# Patient Record
Sex: Female | Born: 1995 | Race: White | Hispanic: Yes | Marital: Married | Smoking: Never smoker
Health system: Southern US, Community
[De-identification: ages and names within clinical notes are randomized; demographics above are authoritative.]

## PROBLEM LIST (undated history)

## (undated) ENCOUNTER — Inpatient Hospital Stay (HOSPITAL_COMMUNITY): Payer: Self-pay

## (undated) DIAGNOSIS — F419 Anxiety disorder, unspecified: Secondary | ICD-10-CM

## (undated) DIAGNOSIS — F32A Depression, unspecified: Secondary | ICD-10-CM

## (undated) HISTORY — PX: TONSILLECTOMY: SUR1361

---

## 2013-12-25 DIAGNOSIS — E049 Nontoxic goiter, unspecified: Secondary | ICD-10-CM | POA: Insufficient documentation

## 2019-11-01 ENCOUNTER — Ambulatory Visit: Payer: Self-pay | Attending: Internal Medicine

## 2019-11-01 DIAGNOSIS — Z23 Encounter for immunization: Secondary | ICD-10-CM

## 2019-11-01 NOTE — Progress Notes (Signed)
   Covid-19 Vaccination Clinic  Name:  Evelyn Landry    MRN: [id number] DOB: [birth year]  11/01/2019  [name] was observed post Covid-19 immunization for 15 minutes without incident. She was provided with Vaccine Information Sheet and instruction to access the V-Safe system.   [name] was instructed to call 911 with any severe reactions post vaccine: Marland Kitchen Difficulty breathing  . Swelling of face and throat  . A fast heartbeat  . A bad rash all over body  . Dizziness and weakness   Immunizations Administered    Name Date Dose VIS Date Route   Pfizer COVID-19 Vaccine 11/01/2019  1:27 PM 0.3 mL 07/17/2019 Intramuscular   Manufacturer: [employer], [employer]   Lot: [id number]   NDC: [address]-3

## 2019-11-22 ENCOUNTER — Ambulatory Visit: Payer: Self-pay

## 2019-11-28 ENCOUNTER — Ambulatory Visit: Payer: Self-pay | Attending: Internal Medicine

## 2019-11-28 DIAGNOSIS — Z23 Encounter for immunization: Secondary | ICD-10-CM

## 2019-11-28 NOTE — Progress Notes (Signed)
   Covid-19 Vaccination Clinic  Name:  Tanica Gaige    MRN: [id number] DOB: [birth year]  11/28/2019  Ms. Tapper was observed post Covid-19 immunization for 15 minutes without incident. She was provided with Vaccine Information Sheet and instruction to access the V-Safe system.   Ms. Tabbert was instructed to call 911 with any severe reactions post vaccine: Marland Kitchen Difficulty breathing  . Swelling of face and throat  . A fast heartbeat  . A bad rash all over body  . Dizziness and weakness   Immunizations Administered    Name Date Dose VIS Date Route   Pfizer COVID-19 Vaccine 11/28/2019  3:14 PM 0.3 mL 09/30/2018 Intramuscular   Manufacturer: [employer], [employer]   Lot: [id number]   NDC: [address]-1

## 2020-02-26 DIAGNOSIS — E66812 Obesity, class 2: Secondary | ICD-10-CM | POA: Insufficient documentation

## 2020-02-26 DIAGNOSIS — Z6839 Body mass index (BMI) 39.0-39.9, adult: Secondary | ICD-10-CM | POA: Insufficient documentation

## 2020-02-26 DIAGNOSIS — N939 Abnormal uterine and vaginal bleeding, unspecified: Secondary | ICD-10-CM | POA: Insufficient documentation

## 2020-11-07 ENCOUNTER — Other Ambulatory Visit: Payer: Self-pay

## 2020-11-07 ENCOUNTER — Encounter (HOSPITAL_COMMUNITY): Payer: Self-pay

## 2020-11-07 ENCOUNTER — Emergency Department (HOSPITAL_COMMUNITY)
Admission: EM | Admit: 2020-11-07 | Discharge: 2020-11-07 | Disposition: A | Discharge status: Home / Self Care | Payer: Managed Care, Other (non HMO) | Attending: Emergency Medicine | Admitting: Emergency Medicine

## 2020-11-07 ENCOUNTER — Emergency Department (HOSPITAL_COMMUNITY): Payer: Managed Care, Other (non HMO)

## 2020-11-07 DIAGNOSIS — R1033 Periumbilical pain: Secondary | ICD-10-CM | POA: Insufficient documentation

## 2020-11-07 DIAGNOSIS — M549 Dorsalgia, unspecified: Secondary | ICD-10-CM | POA: Diagnosis not present

## 2020-11-07 DIAGNOSIS — R1013 Epigastric pain: Secondary | ICD-10-CM | POA: Diagnosis not present

## 2020-11-07 DIAGNOSIS — R61 Generalized hyperhidrosis: Secondary | ICD-10-CM | POA: Diagnosis not present

## 2020-11-07 DIAGNOSIS — R110 Nausea: Secondary | ICD-10-CM | POA: Diagnosis not present

## 2020-11-07 LAB — CBC WITH DIFFERENTIAL/PLATELET
Abs Immature Granulocytes: 0.02 10*3/uL (ref 0.00–0.07)
Basophils Absolute: 0.1 10*3/uL (ref 0.0–0.1)
Basophils Relative: 1 %
Eosinophils Absolute: 0.1 10*3/uL (ref 0.0–0.5)
Eosinophils Relative: 1 %
HCT: 42.7 % (ref 36.0–46.0)
Hemoglobin: 14.6 g/dL (ref 12.0–15.0)
Immature Granulocytes: 0 %
Lymphocytes Relative: 23 %
Lymphs Abs: 2.1 10*3/uL (ref 0.7–4.0)
MCH: 32 pg (ref 26.0–34.0)
MCHC: 34.2 g/dL (ref 30.0–36.0)
MCV: 93.6 fL (ref 80.0–100.0)
Monocytes Absolute: 0.8 10*3/uL (ref 0.1–1.0)
Monocytes Relative: 9 %
Neutro Abs: 6.1 10*3/uL (ref 1.7–7.7)
Neutrophils Relative %: 66 %
Platelets: 251 10*3/uL (ref 150–400)
RBC: 4.56 MIL/uL (ref 3.87–5.11)
RDW: 11.6 % (ref 11.5–15.5)
WBC: 9.1 10*3/uL (ref 4.0–10.5)
nRBC: 0 % (ref 0.0–0.2)

## 2020-11-07 LAB — URINALYSIS, ROUTINE W REFLEX MICROSCOPIC
Bacteria, UA: NONE SEEN
Bilirubin Urine: NEGATIVE
Glucose, UA: NEGATIVE mg/dL
Ketones, ur: NEGATIVE mg/dL
Nitrite: NEGATIVE
Protein, ur: NEGATIVE mg/dL
Specific Gravity, Urine: 1.034 — ABNORMAL HIGH (ref 1.005–1.030)
pH: 6 (ref 5.0–8.0)

## 2020-11-07 LAB — LIPASE, BLOOD: Lipase: 34 U/L (ref 11–51)

## 2020-11-07 LAB — COMPREHENSIVE METABOLIC PANEL
ALT: 19 U/L (ref 0–44)
AST: 18 U/L (ref 15–41)
Albumin: 4.1 g/dL (ref 3.5–5.0)
Alkaline Phosphatase: 59 U/L (ref 38–126)
Anion gap: 9 (ref 5–15)
BUN: 10 mg/dL (ref 6–20)
CO2: 23 mmol/L (ref 22–32)
Calcium: 8.9 mg/dL (ref 8.9–10.3)
Chloride: 105 mmol/L (ref 98–111)
Creatinine, Ser: 0.86 mg/dL (ref 0.44–1.00)
GFR, Estimated: >60 mL/min (ref >60)
Glucose, Bld: 96 mg/dL (ref 70–99)
Potassium: 3.4 mmol/L — ABNORMAL LOW (ref 3.5–5.1)
Sodium: 137 mmol/L (ref 135–145)
Total Bilirubin: 0.7 mg/dL (ref 0.3–1.2)
Total Protein: 7.7 g/dL (ref 6.5–8.1)

## 2020-11-07 LAB — I-STAT BETA HCG BLOOD, ED (MC, WL, AP ONLY): I-stat hCG, quantitative: <5 m[IU]/mL (ref <5)

## 2020-11-07 MED ORDER — NAPROXEN 500 MG PO TABS: 500.0000 mg | ORAL_TABLET | Freq: Two times a day (BID) | ORAL | 0 refills | Status: DC

## 2020-11-07 MED ORDER — ONDANSETRON HCL 4 MG/2ML IJ SOLN
4.0000 mg | Freq: Once | INTRAMUSCULAR | Status: AC
  Administered 2020-11-07: 4 mg via INTRAVENOUS
  Filled 2020-11-07: qty 2

## 2020-11-07 MED ORDER — KETOROLAC TROMETHAMINE 30 MG/ML IJ SOLN
30.0000 mg | Freq: Once | INTRAMUSCULAR | Status: AC
  Administered 2020-11-07: 30 mg via INTRAVENOUS
  Filled 2020-11-07: qty 1

## 2020-11-07 MED ORDER — DICYCLOMINE HCL 10 MG PO CAPS
10.0000 mg | ORAL_CAPSULE | Freq: Once | ORAL | Status: AC
  Administered 2020-11-07: 10 mg via ORAL
  Filled 2020-11-07: qty 1

## 2020-11-07 MED ORDER — IOHEXOL 300 MG/ML  SOLN
100.0000 mL | Freq: Once | INTRAMUSCULAR | Status: AC | PRN
  Administered 2020-11-07: 100 mL via INTRAVENOUS

## 2020-11-07 MED ORDER — ONDANSETRON 4 MG PO TBDP: 4.0000 mg | ORAL_TABLET | Freq: Three times a day (TID) | ORAL | 0 refills | Status: DC | PRN

## 2020-11-07 MED ORDER — MORPHINE SULFATE (PF) 4 MG/ML IV SOLN
4.0000 mg | Freq: Once | INTRAVENOUS | Status: AC
  Administered 2020-11-07: 4 mg via INTRAVENOUS
  Filled 2020-11-07: qty 1

## 2020-11-07 MED ORDER — DICYCLOMINE HCL 20 MG PO TABS: 20.0000 mg | ORAL_TABLET | Freq: Two times a day (BID) | ORAL | 0 refills | Status: DC

## 2020-11-07 NOTE — ED Provider Notes (Signed)
Physical Exam  BP 106/72 (BP Location: Right Arm)   Pulse 74   Temp (!) 97.5 F (36.4 C) (Oral)   Resp 18   Ht 5\' 4"  (1.626 m)   Wt 94.3 kg   LMP 11/06/2020 (Exact Date)   SpO2 97%   BMI 35.70 kg/m   Physical Exam Vitals and nursing note reviewed.  Constitutional:      General: She is not in acute distress.    Appearance: She is well-developed. She is not diaphoretic.  HENT:     Head: Normocephalic and atraumatic.  Eyes:     General: No scleral icterus.    Conjunctiva/sclera: Conjunctivae normal.  Pulmonary:     Effort: Pulmonary effort is normal. No respiratory distress.  Musculoskeletal:     Cervical back: Normal range of motion.  Skin:    Findings: No rash.  Neurological:     Mental Status: She is alert.     ED Course/Procedures     Procedures  MDM   Care of patient assumed from PA McDonald at 7 AM.  Agree with history, physical exam and plan.  See their note for further details.  Briefly, 25 y.o. female with PMH/PSH as below who presents with periumbilical abdominal pain. Reports lower abdominal pain as well. Associated nausea.  Denies any urinary or pelvic complaints. Lab work is unremarkable. Pain controlled here.  History reviewed. No pertinent past medical history.   Current Plan: Obtain CT of the abdomen and pelvis and reassess.   MDM/ED Course: 8:09 AM CT scan without any abnormalities.  8:13 AM On recheck patient asking for additional dose of pain medication.  Informed her of results of lab work and CT scan.  Will recheck after Toradol.  9:03 AM Patient reports improvement in pain but still present.  She remains hemodynamically stable and able to tolerate p.o. intake without difficulty. Will give Bentyl and discharged home with symptomatic treatment.  She is unsure if this could be related to her menstrual cramps which is a possibility as she is currently on her.  And lab work and imaging is otherwise  unremarkable  Consults: None   Significant labs/images: CT ABDOMEN PELVIS W CONTRAST  Result Date: 11/07/2020 CLINICAL DATA:  Umbilical and epigastric pain accompanied by nausea/vomiting EXAM: CT ABDOMEN AND PELVIS WITH CONTRAST TECHNIQUE: Multidetector CT imaging of the abdomen and pelvis was performed using the standard protocol following bolus administration of intravenous contrast. CONTRAST:  01/07/2021 OMNIPAQUE IOHEXOL 300 MG/ML  SOLN COMPARISON:  None. FINDINGS: Lower chest: No acute abnormality. Hepatobiliary: Geographic hypoattenuation in the left hemi-liver adjacent to the fissure for the falciform ligament is nonspecific but most suggestive of benign focal fatty infiltration. Normal hepatic contour and morphology. No discrete hepatic lesions. Normal appearance of the gallbladder. No intra or extrahepatic biliary ductal dilatation. Pancreas: Unremarkable. No pancreatic ductal dilatation or surrounding inflammatory changes. Spleen: Normal in size without focal abnormality. Adrenals/Urinary Tract: Adrenal glands are unremarkable. Kidneys are normal, without renal calculi, focal lesion, or hydronephrosis. Bladder is unremarkable. Stomach/Bowel: Stomach is within normal limits. Appendix appears normal. No evidence of bowel wall thickening, distention, or inflammatory changes. Vascular/Lymphatic: No significant vascular findings are present. No enlarged abdominal or pelvic lymph nodes. Reproductive: Uterus and bilateral adnexa are unremarkable. Other: No abdominal wall hernia or abnormality. No abdominopelvic ascites. Musculoskeletal: No acute fracture or aggressive appearing lytic or blastic osseous lesion. IMPRESSION: No acute abnormality within the abdomen or pelvis. Electronically Signed   By: M.D.   On: 11/07/2020  07:36    I personally reviewed and interpreted all labs.   Patient is hemodynamically stable, in NAD, and able to ambulate in the ED. Evaluation does not show pathology  that would require ongoing emergent intervention or inpatient treatment. I explained the diagnosis to the patient. Pain has been managed and has no complaints prior to discharge. Patient is comfortable with above plan and is stable for discharge at this time. All questions were answered prior to disposition. Strict return precautions for returning to the ED were discussed. Encouraged follow up with PCP.   An After Visit Summary was printed and given to the patient.   Portions of this note were generated with Scientist, clinical (histocompatibility and immunogenetics). Dictation errors may occur despite best attempts at proofreading.     Dietrich Pates, PA-C 11/07/20 5993    Virgina Norfolk, DO 11/07/20 5701

## 2020-11-07 NOTE — Discharge Instructions (Addendum)
Take medications as needed to help with your symptoms. Follow-up with your primary care provider. Make sure you are drinking plenty of fluids and slowly advance your diet as tolerated. Return to the ER if you start to experience worsening pain, uncontrollable vomiting, fever, shortness of breath.

## 2020-11-07 NOTE — ED Notes (Signed)
Pt friend Christella Noa) given note stating he accompanied her to ED

## 2020-11-07 NOTE — ED Triage Notes (Signed)
Pt came in with c/o abdominal pain times one week. She states it started last Monday. She states it went away for a while, but for the last three days it has been constant and worsening. C/o pain in umbilical and epigastric region. Endorses nausea/vomiting

## 2020-11-07 NOTE — ED Provider Notes (Signed)
[facility]-EMERGENCY DEPT Provider Note   CSN: [id number] Arrival date & time: 11/07/20  0454     History Chief Complaint  Patient presents with  . Abdominal Pain    Evelyn Landry is a 25 y.o. female with no chronic medical conditions who presents emergency department with a chief complaint of abdominal pain.  The patient reports that she began having epigastric and periumbilical abdominal pain with associated back pain approximately 1 week ago.  Approximately 4 days ago, the pain resolved for 24 hours, but returned the next day and became constant and has progressively worsened in intensity since onset.  She characterizes the pain as "more intense than dull" with intermittent sharpness.  No known alleviating factors, but pain is worse with movement.  She reports that she is minimally slept over the last few days due to the intensity of the pain.  She reports that she has barely been able to get out of bed due to her symptoms.  She reports associated nausea.  She has had intermittent nonbloody, nonbilious vomiting, but last episode was more than 24 hours ago.  She denies fever, chills, cough, chest pain, shortness of breath, dysuria, hematuria, urinary frequency or hesitancy, vaginal bleeding, vaginal discharge, diarrhea, constipation.   No treatment prior to arrival.  No history of similar pain.  No known sick contacts.  No history of abdominal surgery.  She is a never smoker.  Denies alcohol use.  No illicit or recreational substance use.  No concerns for pregnancy.  LMP 11/06/20.  The history is provided by the patient and medical records. No language interpreter was used.       No past medical history on file.  There are no problems to display for this patient.   OB History   No obstetric history on file.     No family history on file.  Social History   Tobacco Use  . Smoking status: Never Smoker  Substance Use Topics  . Alcohol use: Not Currently   . Drug use: Never    Home Medications Prior to Admission medications   Not on File    Allergies    Patient has no known allergies.  Review of Systems   Review of Systems  Constitutional: Negative for activity change, chills and fever.  HENT: Negative for congestion and sore throat.   Respiratory: Negative for cough, shortness of breath and wheezing.   Cardiovascular: Negative for chest pain.  Gastrointestinal: Positive for nausea and vomiting. Negative for abdominal pain, anal bleeding, blood in stool, constipation and diarrhea.  Genitourinary: Positive for vaginal bleeding. Negative for decreased urine volume, dysuria, flank pain, frequency, hematuria, menstrual problem, pelvic pain, urgency, vaginal discharge and vaginal pain.  Musculoskeletal: Positive for back pain. Negative for arthralgias, gait problem, joint swelling, myalgias, neck pain and neck stiffness.  Skin: Negative for rash.  Allergic/Immunologic: Negative for immunocompromised state.  Neurological: Negative for dizziness, seizures, syncope, weakness, numbness and headaches.  Psychiatric/Behavioral: Negative for confusion.    Physical Exam Updated Vital Signs BP 124/88 (BP Location: Left Arm)   Pulse 76   Temp (!) 97.5 F (36.4 C) (Oral)   Resp 18   Ht 5\' 4"  (1.626 m)   Wt 94.3 kg   SpO2 100%   BMI 35.70 kg/m   Physical Exam Vitals and nursing note reviewed.  Constitutional:      Appearance: She is diaphoretic. She is not ill-appearing or toxic-appearing.     Comments: Uncomfortable appearing  HENT:  Head: Normocephalic.  Eyes:     Conjunctiva/sclera: Conjunctivae normal.  Cardiovascular:     Rate and Rhythm: Normal rate and regular rhythm.     Pulses: Normal pulses.     Heart sounds: Normal heart sounds. No murmur heard. No friction rub. No gallop.   Pulmonary:     Effort: Pulmonary effort is normal. No respiratory distress.     Breath sounds: No stridor. No wheezing, rhonchi or rales.   Chest:     Chest wall: No tenderness.  Abdominal:     General: There is no distension.     Palpations: Abdomen is soft. There is no mass.     Tenderness: There is abdominal tenderness. There is right CVA tenderness and guarding. There is no left CVA tenderness or rebound.     Hernia: No hernia is present.     Comments: Generalized tenderness to palpation throughout the abdomen with maximal tenderness in the epigastric region and bilateral lower abdomen.  Exam is limited as patient is writhing in pain.  Abdomen is soft and nondistended.  Musculoskeletal:        General: No tenderness.     Cervical back: Neck supple.     Right lower leg: No edema.     Left lower leg: No edema.  Skin:    General: Skin is warm.     Findings: No rash.  Neurological:     Mental Status: She is alert.  Psychiatric:        Behavior: Behavior normal.     ED Results / Procedures / Treatments   Labs (all labs ordered are listed, but only abnormal results are displayed) Labs Reviewed  COMPREHENSIVE METABOLIC PANEL - Abnormal; Notable for the following components:      Result Value   Potassium 3.4 (*)    All other components within normal limits  CBC WITH DIFFERENTIAL/PLATELET  LIPASE, BLOOD  URINALYSIS, ROUTINE W REFLEX MICROSCOPIC  I-STAT BETA HCG BLOOD, ED ([facility], [facility], [facility] ONLY)    EKG None  Radiology No results found.  Procedures Procedures   Medications Ordered in ED Medications  ondansetron (ZOFRAN) injection 4 mg (4 mg Intravenous Given 11/07/20 0535)  morphine 4 MG/ML injection 4 mg (4 mg Intravenous Given 11/07/20 0535)    ED Course  I have reviewed the triage vital signs and the nursing notes.  Pertinent labs & imaging results that were available during my care of the patient were reviewed by me and considered in my medical decision making (see chart for details).    MDM Rules/Calculators/A&P                          25 year old female with no chronic medical conditions who presents  the emergency department with abdominal pain that initially presented 1 week ago is intermittent pain, but pain became constant over the last few days accompanied by nausea, back pain.  She has had intermittent vomiting, but none for the last 24 hours.  She was diaphoretic and triage, but otherwise has had no constitutional symptoms including fever or chills.  No GU complaints.  Vital signs are stable.  Initial exam with generalized tenderness to palpation throughout the abdomen.  After she was given morphine, repeat exam with more focal tenderness in the epigastric region as well as in the bilateral lower abdomen.  Negative Murphy sign.  No tenderness over McBurney's point.  She also has some right CVA tenderness.  No left CVA tenderness.  Labs have been reviewed and independently interpreted by me.  Pregnancy test is negative.  No leukocytosis.  No anemia.  CMP is pending.  Given repeat exam, patient will require CT abdomen pelvis for further evaluation.  CT is also pending.  Patient care transferred to [facility] at the end of my shift to follow-up on CMP, urinalysis, and CT abdomen pelvis. Patient presentation, ED course, and plan of care discussed with review of all pertinent labs and imaging. Please see his/her note for further details regarding further ED course and disposition.  Final Clinical Impression(s) / ED Diagnoses Final diagnoses:  None    Rx / DC Orders ED Discharge Orders    None       Nayda Riesen A, PA-C 11/07/20 0707    Melene Plan, DO 11/07/20 [phone]

## 2021-01-09 ENCOUNTER — Inpatient Hospital Stay (HOSPITAL_COMMUNITY)
Admission: AD | Admit: 2021-01-09 | Discharge: 2021-01-09 | Disposition: A | Discharge status: Home / Self Care | Payer: Medicaid Other | Attending: Obstetrics and Gynecology | Admitting: Obstetrics and Gynecology

## 2021-01-09 ENCOUNTER — Other Ambulatory Visit: Payer: Self-pay

## 2021-01-09 ENCOUNTER — Encounter (HOSPITAL_COMMUNITY): Payer: Self-pay | Admitting: Obstetrics and Gynecology

## 2021-01-09 DIAGNOSIS — O99281 Endocrine, nutritional and metabolic diseases complicating pregnancy, first trimester: Secondary | ICD-10-CM | POA: Insufficient documentation

## 2021-01-09 DIAGNOSIS — E860 Dehydration: Secondary | ICD-10-CM | POA: Insufficient documentation

## 2021-01-09 DIAGNOSIS — Z3A09 9 weeks gestation of pregnancy: Secondary | ICD-10-CM | POA: Insufficient documentation

## 2021-01-09 DIAGNOSIS — O210 Mild hyperemesis gravidarum: Secondary | ICD-10-CM | POA: Diagnosis not present

## 2021-01-09 LAB — URINALYSIS, ROUTINE W REFLEX MICROSCOPIC
Bilirubin Urine: NEGATIVE
Glucose, UA: NEGATIVE mg/dL
Hgb urine dipstick: NEGATIVE
Ketones, ur: 5 mg/dL — AB
Nitrite: NEGATIVE
Protein, ur: NEGATIVE mg/dL
Specific Gravity, Urine: 1.025 (ref 1.005–1.030)
pH: 6 (ref 5.0–8.0)

## 2021-01-09 LAB — CBC
HCT: 40.5 % (ref 36.0–46.0)
Hemoglobin: 13.6 g/dL (ref 12.0–15.0)
MCH: 31.9 pg (ref 26.0–34.0)
MCHC: 33.6 g/dL (ref 30.0–36.0)
MCV: 95.1 fL (ref 80.0–100.0)
Platelets: 275 10*3/uL (ref 150–400)
RBC: 4.26 MIL/uL (ref 3.87–5.11)
RDW: 12 % (ref 11.5–15.5)
WBC: 11.5 10*3/uL — ABNORMAL HIGH (ref 4.0–10.5)
nRBC: 0 % (ref 0.0–0.2)

## 2021-01-09 LAB — COMPREHENSIVE METABOLIC PANEL
ALT: 22 U/L (ref 0–44)
AST: 22 U/L (ref 15–41)
Albumin: 3.7 g/dL (ref 3.5–5.0)
Alkaline Phosphatase: 48 U/L (ref 38–126)
Anion gap: 10 (ref 5–15)
BUN: 9 mg/dL (ref 6–20)
CO2: 22 mmol/L (ref 22–32)
Calcium: 9.1 mg/dL (ref 8.9–10.3)
Chloride: 104 mmol/L (ref 98–111)
Creatinine, Ser: 0.66 mg/dL (ref 0.44–1.00)
GFR, Estimated: >60 mL/min (ref >60)
Glucose, Bld: 75 mg/dL (ref 70–99)
Potassium: 3.7 mmol/L (ref 3.5–5.1)
Sodium: 136 mmol/L (ref 135–145)
Total Bilirubin: 0.7 mg/dL (ref 0.3–1.2)
Total Protein: 7.5 g/dL (ref 6.5–8.1)

## 2021-01-09 LAB — POCT PREGNANCY, URINE: Preg Test, Ur: POSITIVE — AB

## 2021-01-09 LAB — LIPASE, BLOOD: Lipase: 33 U/L (ref 11–51)

## 2021-01-09 MED ORDER — SODIUM CHLORIDE 0.9 % IV SOLN
25.0000 mg | Freq: Once | INTRAVENOUS | Status: AC
  Administered 2021-01-09: 25 mg via INTRAVENOUS
  Filled 2021-01-09: qty 1

## 2021-01-09 MED ORDER — FAMOTIDINE 20 MG PO TABS: 20.0000 mg | ORAL_TABLET | Freq: Every day | ORAL | 0 refills | Status: DC

## 2021-01-09 MED ORDER — LACTATED RINGERS IV BOLUS
1000.0000 mL | Freq: Once | INTRAVENOUS | Status: AC
  Administered 2021-01-09: 1000 mL via INTRAVENOUS

## 2021-01-09 MED ORDER — PROMETHAZINE HCL 25 MG PO TABS: 12.5000 mg | ORAL_TABLET | Freq: Four times a day (QID) | ORAL | 0 refills | Status: DC | PRN

## 2021-01-09 MED ORDER — SCOPOLAMINE 1 MG/3DAYS TD PT72: 1.0000 | MEDICATED_PATCH | TRANSDERMAL | 0 refills | Status: DC

## 2021-01-09 MED ORDER — FAMOTIDINE IN NACL 20-0.9 MG/50ML-% IV SOLN
20.0000 mg | Freq: Once | INTRAVENOUS | Status: AC
  Administered 2021-01-09: 20 mg via INTRAVENOUS
  Filled 2021-01-09: qty 50

## 2021-01-09 MED ORDER — SCOPOLAMINE 1 MG/3DAYS TD PT72
1.0000 | MEDICATED_PATCH | TRANSDERMAL | Status: DC
  Administered 2021-01-09: 1.5 mg via TRANSDERMAL
  Filled 2021-01-09: qty 1

## 2021-01-09 NOTE — Progress Notes (Addendum)
Pt instructed to alert staff when able to void and provide urine specimen.  Pt informed to have a seat in MAU lobby until then.

## 2021-01-09 NOTE — MAU Provider Note (Signed)
History     CSN: [id number]  Arrival date and time: 01/09/21 0949   Event Date/Time   First Provider Initiated Contact with Patient 01/09/21 1206      Chief Complaint  Patient presents with  . Emesis  . Nausea   25 y.o. [id number] @9 .0 wks presenting with N/V. Reports onset a few weeks ago but has worsened over the last 4 days. She's used which has helped until now. Reports not tolerating food or fluids. Denies fever, diarrhea, or sick contacts. Reports burning upper abd pain when she vomits. Denies VB. Reports 4lb weight loss over last 1.5 wks.   OB History    Gravida  4   Para  1   Term  1   Preterm      AB  2   Living  1     SAB  1   IAB  1   Ectopic      Multiple      Live Births  1           History reviewed. No pertinent past medical history.  Past Surgical History:  Procedure Laterality Date  . TONSILLECTOMY      History reviewed. No pertinent family history.  Social History   Tobacco Use  . Smoking status: Never Smoker  . Smokeless tobacco: Never Used  Substance Use Topics  . Alcohol use: Not Currently  . Drug use: Never    Allergies: No Known Allergies  Medications Prior to Admission  Medication Sig Dispense Refill Last Dose  . dicyclomine (BENTYL) 20 MG tablet Take 1 tablet (20 mg total) by mouth 2 (two) times daily. 10 tablet 0   . naproxen (NAPROSYN) 500 MG tablet Take 1 tablet (500 mg total) by mouth 2 (two) times daily. 30 tablet 0   . ondansetron (ZOFRAN ODT) 4 MG disintegrating tablet Take 1 tablet (4 mg total) by mouth every 8 (eight) hours as needed for nausea or vomiting. 4 tablet 0     Review of Systems  Constitutional: Negative for fever.  Gastrointestinal: Positive for abdominal pain (upper), nausea and vomiting. Negative for diarrhea.  Genitourinary: Negative for vaginal bleeding.   Physical Exam   Blood pressure 132/85, pulse 94, temperature 98.1 F (36.7 C), temperature source Oral, resp. rate 18, height  5\' 1"  (1.549 m), weight 94 kg, last menstrual period 11/07/2020, SpO2 100 %.  Physical Exam Vitals and nursing note reviewed.  Constitutional:      Appearance: Normal appearance.  HENT:     Head: Normocephalic and atraumatic.  Cardiovascular:     Rate and Rhythm: Normal rate.  Pulmonary:     Effort: Pulmonary effort is normal. No respiratory distress.  Abdominal:     General: There is no distension.     Palpations: Abdomen is soft. There is no mass.     Tenderness: There is no abdominal tenderness. There is no guarding or rebound.     Hernia: No hernia is present.  Musculoskeletal:        General: Normal range of motion.     Cervical back: Normal range of motion.  Skin:    General: Skin is warm and dry.  Neurological:     General: No focal deficit present.     Mental Status: She is alert and oriented to person, place, and time.  Psychiatric:        Mood and Affect: Mood normal.        Behavior: Behavior normal.  Results for orders placed or performed during the hospital encounter of 01/09/21 (from the past 24 hour(s))  Pregnancy, urine POC     Status: Abnormal   Collection Time: 01/09/21 11:43 AM  Result Value Ref Range   Preg Test, Ur POSITIVE (A) NEGATIVE  Urinalysis, Routine w reflex microscopic Urine, Clean Catch     Status: Abnormal   Collection Time: 01/09/21 11:47 AM  Result Value Ref Range   Color, Urine YELLOW YELLOW   APPearance HAZY (A) CLEAR   Specific Gravity, Urine 1.025 1.005 - 1.030   pH 6.0 5.0 - 8.0   Glucose, UA NEGATIVE NEGATIVE mg/dL   Hgb urine dipstick NEGATIVE NEGATIVE   Bilirubin Urine NEGATIVE NEGATIVE   Ketones, ur 5 (A) NEGATIVE mg/dL   Protein, ur NEGATIVE NEGATIVE mg/dL   Nitrite NEGATIVE NEGATIVE   Leukocytes,Ua MODERATE (A) NEGATIVE   RBC / HPF 0-5 0 - 5 RBC/hpf   WBC, UA 11-20 0 - 5 WBC/hpf   Bacteria, UA RARE (A) NONE SEEN   Squamous Epithelial / LPF 6-10 0 - 5   Mucus PRESENT    MAU Course   Procedures LR Pepcid Phenergan Scopolamine  MDM Labs ordered and reviewed. Feels better, no further emesis. Tolerating gingerale. Stable for discharge home.   Assessment and Plan   1. [redacted] weeks gestation of pregnancy   2. Morning sickness   3. Dehydration    Discharge home Follow up at [facility] in 2 days as scheduled Rx Phenergan (po or pv) Rx Scopolamine Rx Pepcid Return precautions  Allergies as of 01/09/2021   No Known Allergies     Medication List    STOP taking these medications   dicyclomine 20 MG tablet Commonly known as: BENTYL   naproxen 500 MG tablet Commonly known as: NAPROSYN   ondansetron 4 MG disintegrating tablet Commonly known as: Zofran ODT     TAKE these medications   famotidine 20 MG tablet Commonly known as: PEPCID Take 1 tablet (20 mg total) by mouth at bedtime.   promethazine 25 MG tablet Commonly known as: PHENERGAN Take 0.5-1 tablets (12.5-25 mg total) by mouth every 6 (six) hours as needed for nausea or vomiting.   scopolamine 1 MG/3DAYS Commonly known as: TRANSDERM-SCOP Place 1 patch (1.5 mg total) onto the skin every 3 (three) days. Start taking on: January 12, 2021       Donette Larry, [place] 01/09/2021, 12:20 PM

## 2021-01-09 NOTE — MAU Note (Signed)
Presents with c/o N/V, reports unable to keep food or fluids down since Friday afternoon.  LMP approx 11/07/2020.  +HPT.  Denies VB.

## 2021-01-09 NOTE — MAU Note (Signed)
Pt reporting stinging and burning with phenergan infusion. Infusion paused and only LR infusing at this time. Pt reports it is feeling less uncomfortable. Pt received majority of phenergan infusion prior to feeling the discomfort and is reporting improved nausea. Donette Larry CNM made aware.

## 2021-01-09 NOTE — Discharge Instructions (Signed)
Morning Sickness  Morning sickness is when you feel like you may vomit (feel nauseous) during pregnancy. Sometimes, you may vomit. Morning sickness most often happens in the morning, but it can also happen at any time of the day. Some women may have morning sickness that makes them vomit all the time. This is a more serious problem that needs treatment. What are the causes? The cause of this condition is not known. What increases the risk?  You had vomiting or a feeling like you may vomit before your pregnancy.  You had morning sickness in another pregnancy.  You are pregnant with more than one baby, such as twins. What are the signs or symptoms?  Feeling like you may vomit.  Vomiting. How is this treated? Treatment is usually not needed for this condition. You may only need to change what you eat. In some cases, your doctor may give you some things to take for your condition. These include:  Vitamin B6 supplements.  Medicines to treat the feeling that you may vomit.  Ginger. Follow these instructions at home: Medicines  Take over-the-counter and prescription medicines only as told by your doctor. Do not take any medicines until you talk with your doctor about them first.  Take multivitamins before you get pregnant. These can stop or lessen the symptoms of morning sickness. Eating and drinking  Eat dry toast or crackers before getting out of bed.  Eat 5 or 6 small meals a day.  Eat dry and bland foods like rice and baked potatoes.  Do not eat greasy, fatty, or spicy foods.  Have someone cook for you if the smell of food causes you to vomit or to feel like you may vomit.  If you feel like you may vomit after taking prenatal vitamins, take them at night or with a snack.  Eat protein foods when you need a snack. Nuts, yogurt, and cheese are good choices.  Drink fluids throughout the day.  Try ginger ale made with real ginger, ginger tea made from fresh grated ginger, or  ginger candies. General instructions  Do not smoke or use any products that contain nicotine or tobacco. If you need help quitting, ask your doctor.  Use an air purifier to keep the air in your house free of smells.  Get lots of fresh air.  Try to avoid smells that make you feel sick.  Try wearing an acupressure wristband. This is a wristband that is used to treat seasickness.  Try a treatment called acupuncture. In this treatment, a doctor puts needles into certain areas of your body to make you feel better. Contact a doctor if:  You need medicine to feel better.  You feel dizzy or light-headed.  You are losing weight. Get help right away if:  The feeling that you may vomit will not go away, or you cannot stop vomiting.  You faint.  You have very bad pain in your belly. Summary  Morning sickness is when you feel like you may vomit (feel nauseous) during pregnancy.  You may feel sick in the morning, but you can feel this way at any time of the day.  Making some changes to what you eat may help your symptoms go away. This information is not intended to replace advice given to you by your health care provider. Make sure you discuss any questions you have with your health care provider. Document Revised: 03/07/2020 Document Reviewed: 02/15/2020 Elsevier Patient Education  2021 [employer]   Dehydration, Adult  Dehydration is condition in which there is not enough water or other fluids in the body. This happens when a person loses more fluids than he or she takes in. Important body parts cannot work right without the right amount of fluids. Any loss of fluids from the body can cause dehydration. Dehydration can be mild, worse, or very bad. It should be treated right away to keep it from getting very bad. What are the causes? This condition may be caused by:  Conditions that cause loss of water or other fluids, such as: ? Watery poop (diarrhea). ? Vomiting. ? Sweating a  lot. ? Peeing (urinating) a lot.  Not drinking enough fluids, especially when you: ? Are ill. ? Are doing things that take a lot of energy to do.  Other illnesses and conditions, such as fever or infection.  Certain medicines, such as medicines that take extra fluid out of the body (diuretics).  Lack of safe drinking water.  Not being able to get enough water and food. What increases the risk? The following factors may make you more likely to develop this condition:  Having a long-term (chronic) illness that has not been treated the right way, such as: ? Diabetes. ? Heart disease. ? Kidney disease.  Being 60 years of age or older.  Having a disability.  Living in a place that is high above the ground or sea (high in altitude). The thinner, dried air causes more fluid loss.  Doing exercises that put stress on your body for a long time. What are the signs or symptoms? Symptoms of dehydration depend on how bad it is. Mild or worse dehydration  Thirst.  Dry lips or dry mouth.  Feeling dizzy or light-headed, especially when you stand up from sitting.  Muscle cramps.  Your body making: ? Dark pee (urine). Pee may be the color of tea. ? Less pee than normal. ? Less tears than normal.  Headache. Very bad dehydration  Changes in skin. Skin may: ? Be cold to the touch (clammy). ? Be blotchy or pale. ? Not go back to normal right after you lightly pinch it and let it go.  Little or no tears, pee, or sweat.  Changes in vital signs, such as: ? Fast breathing. ? Low blood pressure. ? Weak pulse. ? Pulse that is more than 100 beats a minute when you are sitting still.  Other changes, such as: ? Feeling very thirsty. ? Eyes that look hollow (sunken). ? Cold hands and feet. ? Being mixed up (confused). ? Being very tired (lethargic) or having trouble waking from sleep. ? Short-term weight loss. ? Loss of consciousness. How is this treated? Treatment for this  condition depends on how bad it is. Treatment should start right away. Do not wait until your condition gets very bad. Very bad dehydration is an emergency. You will need to go to a hospital.  Mild or worse dehydration can be treated at home. You may be asked to: ? Drink more fluids. ? Drink an oral rehydration solution (ORS). This drink helps get the right amounts of fluids and salts and minerals in the blood (electrolytes).  Very bad dehydration can be treated: ? With fluids through an IV tube. ? By getting normal levels of salts and minerals in your blood. This is often done by giving salts and minerals through a tube. The tube is passed through your nose and into your stomach. ? By treating the root cause. Follow these instructions at home:  Oral rehydration solution If told by your doctor, drink an ORS:  Make an ORS. Use instructions on the package.  Start by drinking small amounts, about  cup (120 mL) every 5-10 minutes.  Slowly drink more until you have had the amount that your doctor said to have. Eating and drinking  Drink enough clear fluid to keep your pee pale yellow. If you were told to drink an ORS, finish the ORS first. Then, start slowly drinking other clear fluids. Drink fluids such as: ? Water. Do not drink only water. Doing that can make the salt (sodium) level in your body get too low. ? Water from ice chips you suck on. ? Fruit juice that you have added water to (diluted). ? Low-calorie sports drinks.  Eat foods that have the right amounts of salts and minerals, such as: ? Bananas. ? Oranges. ? Potatoes. ? Tomatoes. ? Spinach.  Do not drink alcohol.  Avoid: ? Drinks that have a lot of sugar. These include:  High-calorie sports drinks.  Fruit juice that you did not add water to.  Soda.  Caffeine. ? Foods that are greasy or have a lot of fat or sugar.         General instructions  Take over-the-counter and prescription medicines only as told by  your doctor.  Do not take salt tablets. Doing that can make the salt level in your body get too high.  Return to your normal activities as told by your doctor. Ask your doctor what activities are safe for you.  Keep all follow-up visits as told by your doctor. This is important. Contact a doctor if:  You have pain in your belly (abdomen) and the pain: ? Gets worse. ? Stays in one place.  You have a rash.  You have a stiff neck.  You get angry or annoyed (irritable) more easily than normal.  You are more tired or have a harder time waking than normal.  You feel: ? Weak or dizzy. ? Very thirsty. Get help right away if you have:  Any symptoms of very bad dehydration.  Symptoms of vomiting, such as: ? You cannot eat or drink without vomiting. ? Your vomiting gets worse or does not go away. ? Your vomit has blood or green stuff in it.  Symptoms that get worse with treatment.  A fever.  A very bad headache.  Problems with peeing or pooping (having a bowel movement), such as: ? Watery poop that gets worse or does not go away. ? Blood in your poop (stool). This may cause poop to look black and tarry. ? Not peeing in 6-8 hours. ? Peeing only a small amount of very dark pee in 6-8 hours.  Trouble breathing. These symptoms may be an emergency. Do not wait to see if the symptoms will go away. Get medical help right away. Call your local emergency services (911 in the [place] Do not drive yourself to the hospital. Summary  Dehydration is a condition in which there is not enough water or other fluids in the body. This happens when a person loses more fluids than he or she takes in.  Treatment for this condition depends on how bad it is. Treatment should be started right away. Do not wait until your condition gets very bad.  Drink enough clear fluid to keep your pee pale yellow. If you were told to drink an oral rehydration solution (ORS), finish the ORS first. Then, start slowly  drinking other clear fluids.  Take over-the-counter and prescription medicines only as told by your doctor.  Get help right away if you have any symptoms of very bad dehydration. This information is not intended to replace advice given to you by your health care provider. Make sure you discuss any questions you have with your health care provider. Document Revised: 03/05/2019 Document Reviewed: 03/05/2019 Elsevier Patient Education  2021 [employer] Inc.  

## 2021-01-11 LAB — OB RESULTS CONSOLE VARICELLA ZOSTER ANTIBODY, IGG: Varicella: IMMUNE

## 2021-01-11 LAB — OB RESULTS CONSOLE HEPATITIS B SURFACE ANTIGEN: Hepatitis B Surface Ag: NEGATIVE

## 2021-01-11 LAB — OB RESULTS CONSOLE ABO/RH: RH Type: POSITIVE

## 2021-01-11 LAB — OB RESULTS CONSOLE RPR: RPR: NONREACTIVE

## 2021-01-11 LAB — OB RESULTS CONSOLE GC/CHLAMYDIA
Chlamydia: NEGATIVE
Gonorrhea: NEGATIVE

## 2021-01-11 LAB — OB RESULTS CONSOLE HIV ANTIBODY (ROUTINE TESTING): HIV: NONREACTIVE

## 2021-01-11 LAB — OB RESULTS CONSOLE ANTIBODY SCREEN: Antibody Screen: NEGATIVE

## 2021-01-11 LAB — OB RESULTS CONSOLE RUBELLA ANTIBODY, IGM: Rubella: IMMUNE

## 2021-01-30 ENCOUNTER — Encounter (HOSPITAL_COMMUNITY): Payer: Self-pay | Admitting: Obstetrics and Gynecology

## 2021-01-30 ENCOUNTER — Inpatient Hospital Stay (HOSPITAL_COMMUNITY)
Admission: AD | Admit: 2021-01-30 | Discharge: 2021-01-30 | Disposition: A | Discharge status: Home / Self Care | Payer: Medicaid Other | Attending: Obstetrics and Gynecology | Admitting: Obstetrics and Gynecology

## 2021-01-30 ENCOUNTER — Other Ambulatory Visit: Payer: Self-pay

## 2021-01-30 DIAGNOSIS — O219 Vomiting of pregnancy, unspecified: Secondary | ICD-10-CM | POA: Diagnosis present

## 2021-01-30 DIAGNOSIS — Z3A12 12 weeks gestation of pregnancy: Secondary | ICD-10-CM | POA: Diagnosis not present

## 2021-01-30 DIAGNOSIS — O210 Mild hyperemesis gravidarum: Secondary | ICD-10-CM

## 2021-01-30 LAB — URINALYSIS, ROUTINE W REFLEX MICROSCOPIC
Bilirubin Urine: NEGATIVE
Glucose, UA: NEGATIVE mg/dL
Hgb urine dipstick: NEGATIVE
Ketones, ur: 80 mg/dL — AB
Leukocytes,Ua: NEGATIVE
Nitrite: NEGATIVE
Protein, ur: NEGATIVE mg/dL
Specific Gravity, Urine: 1.023 (ref 1.005–1.030)
pH: 6 (ref 5.0–8.0)

## 2021-01-30 MED ORDER — ONDANSETRON 4 MG PO TBDP: 4.0000 mg | ORAL_TABLET | Freq: Three times a day (TID) | ORAL | 0 refills | Status: DC | PRN

## 2021-01-30 MED ORDER — ONDANSETRON HCL 4 MG/2ML IJ SOLN
4.0000 mg | Freq: Once | INTRAMUSCULAR | Status: AC
  Administered 2021-01-30: 4 mg via INTRAVENOUS
  Filled 2021-01-30: qty 2

## 2021-01-30 MED ORDER — SCOPOLAMINE 1 MG/3DAYS TD PT72: 1.0000 | MEDICATED_PATCH | TRANSDERMAL | 0 refills | Status: AC

## 2021-01-30 MED ORDER — LACTATED RINGERS IV BOLUS
1000.0000 mL | Freq: Once | INTRAVENOUS | Status: AC
  Administered 2021-01-30: 1000 mL via INTRAVENOUS

## 2021-01-30 MED ORDER — SCOPOLAMINE 1 MG/3DAYS TD PT72
1.0000 | MEDICATED_PATCH | Freq: Once | TRANSDERMAL | Status: DC
  Administered 2021-01-30: 1.5 mg via TRANSDERMAL
  Filled 2021-01-30: qty 1

## 2021-01-30 MED ORDER — FAMOTIDINE IN NACL 20-0.9 MG/50ML-% IV SOLN
20.0000 mg | Freq: Once | INTRAVENOUS | Status: AC
  Administered 2021-01-30: 20 mg via INTRAVENOUS
  Filled 2021-01-30: qty 50

## 2021-01-30 MED ORDER — PROMETHAZINE HCL 25 MG PO TABS: 25.0000 mg | ORAL_TABLET | Freq: Four times a day (QID) | ORAL | 0 refills | Status: DC | PRN
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
## 2021-01-30 NOTE — MAU Note (Signed)
Pt states she hasn't taken any of her meds since Thursday, because she can't keep them down.
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
## 2021-05-18 ENCOUNTER — Ambulatory Visit: Payer: Medicaid Other | Admitting: Physical Therapy

## 2021-05-22 ENCOUNTER — Ambulatory Visit: Payer: Medicaid Other | Attending: Obstetrics and Gynecology | Admitting: Physical Therapy

## 2021-05-22 ENCOUNTER — Encounter: Payer: Self-pay | Admitting: Physical Therapy

## 2021-05-22 ENCOUNTER — Other Ambulatory Visit: Payer: Self-pay

## 2021-05-22 DIAGNOSIS — R269 Unspecified abnormalities of gait and mobility: Secondary | ICD-10-CM | POA: Diagnosis present

## 2021-05-22 DIAGNOSIS — R252 Cramp and spasm: Secondary | ICD-10-CM | POA: Insufficient documentation

## 2021-05-22 DIAGNOSIS — M6281 Muscle weakness (generalized): Secondary | ICD-10-CM | POA: Diagnosis present

## 2021-05-22 NOTE — Therapy (Signed)
[facility] @ [facility] [address] [place], [place], [address] Phone: [phone]   Fax:  [phone]  Physical Therapy Evaluation  Patient Details  Name: Lilyanne Mcquown MRN: [id number] Date of Birth: 1996-04-28 Referring Provider (PT): Pryor Ochoa [place], [place]   Encounter Date: 05/22/2021   PT End of Session - 05/22/21 1146     Visit Number 1    Date for PT Re-Evaluation 08/22/21    Authorization Type [employer]    PT Start Time 1015    PT Stop Time 1058    PT Time Calculation (min) 43 min    Activity Tolerance Patient tolerated treatment well;Patient limited by pain    Behavior During Therapy [facility] for tasks assessed/performed             History reviewed. No pertinent past medical history.  Past Surgical History:  Procedure Laterality Date   TONSILLECTOMY      There were no vitals filed for this visit.    Subjective Assessment - 05/22/21 1019     Subjective Pt reports pain pelvic pain and low back pain with pregnancy. Pt now 27 weeks pregnanct and has pain with all positions, worse in sitting and any single leg activity increases pain and pain started around start of 2nd trimester.    Pertinent History one other child 4yo will be 5 in december.    How long can you sit comfortably? 10-94mins    How long can you stand comfortably? 15-20 mins    How long can you walk comfortably? 15 mins    Patient Stated Goals to have less pain    Currently in Pain? Yes    Pain Score 3    10/10 at worse with transitioning from supine to sitting   Pain Location Pelvis    Pain Orientation Mid    Pain Descriptors / Indicators Sharp;Aching    Pain Type Acute pain    Pain Radiating Towards no    Pain Onset 1 to 4 weeks ago                [facility] PT Assessment - 05/22/21 0001       Assessment   Medical Diagnosis O26.899,M54.50 (ICD-10-CM) - Low back pain during pregnancy    Referring Provider (PT) Mindi Slicker, Sharol Given, DO    Onset Date/Surgical Date --   since 2nd trimester   Prior Therapy yes- Lt hand      Precautions   Precautions Other (comment)   pregnancy     Restrictions   Weight Bearing Restrictions No      Balance Screen   Has the patient fallen in the past 6 months No    Has the patient had a decrease in activity level because of a fear of falling?  No    Is the patient reluctant to leave their home because of a fear of falling?  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      Cognition   Overall Cognitive Status Within Functional Limits for tasks assessed      Sensation   Light Touch Impaired Detail    Additional Comments Rt leg to knee after laying on it too long      Coordination  Gross Motor Movements are Fluid and Coordinated Yes    Fine Motor Movements are Fluid and Coordinated Yes      Posture/Postural Control   Posture/Postural Control Postural limitations    Postural Limitations Rounded Shoulders;Increased lumbar lordosis;Increased thoracic kyphosis;Posterior pelvic tilt      ROM / Strength   AROM / PROM / Strength AROM;Strength      AROM   Overall AROM Comments decreased thoracic and lumbar spine in sidebending and rotation by 50% and flexion and extension by 25%      Strength   Overall Strength Comments decreased hip strength to3+5/ in all directions though limited due to pain      Flexibility   Soft Tissue Assessment /Muscle Length yes      Palpation   Spinal mobility rib flare with pregnancy, decreased expansion    Palpation comment TTP at pubic bone and bil groin areas however majority of pain at middle pubic bone area. Pt denied all pain in back with palpation throughout spine and paraspinals.      Special Tests   Other special tests (+) bil  storl test                        Objective measurements completed on examination: See above findings.     Pelvic Floor Special Questions - 05/22/21 0001     Prior Pelvic/Prostate Exam Yes   abdnormal pap ~25yo but has resolved   Are you Pregnant or attempting pregnancy? Yes    Prior Pregnancies Yes    Number of Pregnancies 3    Number of Vaginal Deliveries 1    Any difficulty with labor and deliveries No    Episiotomy Performed No    Currently Sexually Active Yes    Is this Painful No    History of sexually transmitted disease No    Marinoff Scale no problems    Urinary Leakage Yes    How often infrequent (x2 of memory with heavy cough)    Pad use no    Urinary urgency Yes    Urinary frequency yes- every 1.5 usually   pt reports she feels baby is on bladder more this pregnancy   Fecal incontinence No    Fluid intake 3-4 bottles of water per day    Caffeine beverages 1 cup of coffee in AM    Falling out feeling (prolapse) No    Pelvic Floor Internal Exam deferred this date                       PT Education - 05/22/21 1144     Education Details Pt educated on exam findings, taping and how to remove and remove if there is any skin irritation, pt also educated onn technique to get in and out of bed to decrease pain at pelvis, HEP, POC    Person(s) Educated Patient    Methods Explanation;Demonstration;Tactile cues;Verbal cues;Handout    Comprehension Returned demonstration;Verbalized understanding              PT Short Term Goals - 05/22/21 1154       PT SHORT TERM GOAL #1   Title Pt to be I with HEP    Time 4    Period Weeks    Status New    Target Date 06/19/21      PT SHORT TERM GOAL #2   Title pt to report no more than 5/10 pain with mobility at pelvis  or back to improve tolerance to getting in/out of bed    Time 4    Period Weeks    Status New    Target Date 06/19/21               PT Long Term Goals - 05/22/21 1155        PT LONG TERM GOAL #1   Title Pt to be I with advanced HEP    Time 3    Period Months    Status New    Target Date 08/22/21      PT LONG TERM GOAL #2   Title pt to report no more than 2/10 pain with mobility to improve QOL and improve tolerance to activities    Time 3    Period Months    Status New    Target Date 08/22/21      PT LONG TERM GOAL #3   Title pt to demonstrate improved ability to properly lift 15# to better care for children and decrease risk of back injury and to stabilize pelvis    Time 3    Period Months    Status New    Target Date 08/22/21      PT LONG TERM GOAL #4   Title pt to demonstrate improved bil hip strength to 5/5 for birth prep    Time 3    Period Months    Status New    Target Date 08/22/21                    Plan - 05/22/21 1147     Clinical Impression Statement Pt is 25yo female presenting to clininc reporting high levels of pelvic pain at pubic bone, into bil groin area, then progressing into back. Pt reports pain worse with single leg tasks, now needing to sit to put pants on and greatly increased with pain at stair use, and getting into/out of bed. Pt reports she is limited with all standing/sitting/walking to average of 15 mins before discomfort starts and she needs to change position, if pain gets too high and progresses into back then she is only able to relieve with sleeping. Pt limited during eval due to pain and unable to lay on back for full exam, demonstrated bil hip weakness in all directions though greatly limited by pain in all testing, TTP at pubic bone and groin area and increased pain with SI compression and distraction, (+) stork test. Pt able to decrease pain with sitting then standing. Pt educated on belly band use and placement, taping for improved pelvic stability and techniques to get into/out of bed to decrease pain. PT tapped pt for pregnancy support with K tape and pt reported slight improvement and educated on  how/when to remove and to remove withany skin irritants. Pt denied questions and would benefit from PT to address deficits.    Personal Factors and Comorbidities Time since onset of injury/illness/exacerbation;Comorbidity 1    Comorbidities pregnant    Examination-Activity Limitations Locomotion Level;Transfers;Bed Mobility;Sleep;Stairs;Squat;Stand;Lift;Dressing    Examination-Participation Restrictions Community Activity;Shop;Yard Work    Stability/Clinical Decision Making Stable/Uncomplicated    Clinical Decision Making Low    Rehab Potential Good    PT Frequency 1x / week    PT Duration 8 weeks    PT Treatment/Interventions ADLs/Self Care Home Management;Aquatic Therapy;Functional mobility training;Therapeutic activities;Therapeutic exercise;Neuromuscular re-education;Manual techniques;Patient/family education;Taping;Passive range of motion;Energy conservation    PT Next Visit Plan discuss aquatics, go over HEP, stretching    PT  Home Exercise Plan [id number]    Consulted and Agree with Plan of Care Patient             Patient will benefit from skilled therapeutic intervention in order to improve the following deficits and impairments:  Decreased coordination, Decreased endurance, Difficulty walking, Decreased activity tolerance, Pain, Impaired flexibility, Improper body mechanics, Postural dysfunction, Decreased strength, Decreased mobility  Visit Diagnosis: Cramp and spasm - Plan: PT plan of care cert/re-cert  Muscle weakness (generalized) - Plan: PT plan of care cert/re-cert  Abnormality of gait and mobility - Plan: PT plan of care cert/re-cert     Problem List Patient Active Problem List   Diagnosis Date Noted   Class 2 obesity due to excess calories without serious comorbidity with body mass index (BMI) of 39.0 to 39.9 in adult 02/26/2020   Abnormal uterine bleeding (AUB) 02/26/2020   Goiter 12/25/2013    Otelia Sergeant, PT, DPT 05/22/2210:58 AM   [facility] @ [facility] [address] [place], [place], [address] Phone: [phone]   Fax:  [phone]  Name: Adrean Findlay MRN: [id number] Date of Birth: Feb 06, 1996

## 2021-06-07 ENCOUNTER — Ambulatory Visit: Payer: Medicaid Other | Admitting: Physical Therapy

## 2021-06-14 ENCOUNTER — Other Ambulatory Visit: Payer: Self-pay

## 2021-06-14 ENCOUNTER — Inpatient Hospital Stay (HOSPITAL_COMMUNITY)
Admission: AD | Admit: 2021-06-14 | Discharge: 2021-06-14 | Disposition: A | Discharge status: Home / Self Care | Payer: Medicaid Other | Attending: Obstetrics | Admitting: Obstetrics

## 2021-06-14 ENCOUNTER — Ambulatory Visit: Payer: Medicaid Other | Attending: Obstetrics and Gynecology | Admitting: Physical Therapy

## 2021-06-14 ENCOUNTER — Encounter (HOSPITAL_COMMUNITY): Payer: Self-pay | Admitting: Obstetrics

## 2021-06-14 DIAGNOSIS — O368130 Decreased fetal movements, third trimester, not applicable or unspecified: Secondary | ICD-10-CM | POA: Insufficient documentation

## 2021-06-14 DIAGNOSIS — F419 Anxiety disorder, unspecified: Secondary | ICD-10-CM | POA: Diagnosis not present

## 2021-06-14 DIAGNOSIS — R252 Cramp and spasm: Secondary | ICD-10-CM | POA: Diagnosis present

## 2021-06-14 DIAGNOSIS — O99343 Other mental disorders complicating pregnancy, third trimester: Secondary | ICD-10-CM | POA: Diagnosis not present

## 2021-06-14 DIAGNOSIS — R293 Abnormal posture: Secondary | ICD-10-CM | POA: Diagnosis present

## 2021-06-14 DIAGNOSIS — R55 Syncope and collapse: Secondary | ICD-10-CM | POA: Insufficient documentation

## 2021-06-14 DIAGNOSIS — Z3A31 31 weeks gestation of pregnancy: Secondary | ICD-10-CM | POA: Insufficient documentation

## 2021-06-14 DIAGNOSIS — O26893 Other specified pregnancy related conditions, third trimester: Secondary | ICD-10-CM | POA: Diagnosis not present

## 2021-06-14 DIAGNOSIS — Z8659 Personal history of other mental and behavioral disorders: Secondary | ICD-10-CM | POA: Diagnosis not present

## 2021-06-14 DIAGNOSIS — M6281 Muscle weakness (generalized): Secondary | ICD-10-CM | POA: Diagnosis present

## 2021-06-14 DIAGNOSIS — R42 Dizziness and giddiness: Secondary | ICD-10-CM | POA: Diagnosis not present

## 2021-06-14 DIAGNOSIS — O99891 Other specified diseases and conditions complicating pregnancy: Secondary | ICD-10-CM

## 2021-06-14 LAB — GLUCOSE, CAPILLARY: Glucose-Capillary: 81 mg/dL (ref 70–99)

## 2021-06-14 MED ORDER — HYDROXYZINE HCL 25 MG PO TABS
25.0000 mg | ORAL_TABLET | Freq: Once | ORAL | Status: AC
  Administered 2021-06-14: 25 mg via ORAL
  Filled 2021-06-14: qty 1

## 2021-06-14 NOTE — MAU Provider Note (Signed)
History     CSN: [id number]  Arrival date and time: 06/14/21 1801   Event Date/Time   First Provider Initiated Contact with Patient 06/14/21 1833      Chief Complaint  Patient presents with   Decreased Fetal Movement   Evelyn Landry is a 25 y.o. [id number] at [redacted]w[redacted]d who presents today with hypoglycemia. She reports that she has been feeling dizzy like she was about to faint, so her OBGYN gave her a glucose monitor to check blood sugar. Today she was feeling dizzy again, so she checked and her blood sugar was 72 and then later she still felt bad and checked and it was 88. Still not feeling well so she ate and it was 110. Then she went to PT and when she got home blood sugar was around 80 again. Since then she hasn't been feeling the baby move as much. She called her OB, and did not get a call back. So she started having a lot of anxiety and didn't know what to do. Patient states that she doesn't take anything for her anxiety.    OB History     Gravida  4   Para  1   Term  1   Preterm      AB  2   Living  1      SAB  1   IAB  1   Ectopic      Multiple      Live Births  1           History reviewed. No pertinent past medical history.  Past Surgical History:  Procedure Laterality Date   TONSILLECTOMY      History reviewed. No pertinent family history.  Social History   Tobacco Use   Smoking status: Never   Smokeless tobacco: Never  Substance Use Topics   Alcohol use: Not Currently   Drug use: Never    Allergies: No Known Allergies  Medications Prior to Admission  Medication Sig Dispense Refill Last Dose   famotidine (PEPCID) 20 MG tablet Take 1 tablet (20 mg total) by mouth at bedtime. 30 tablet 0    ondansetron (ZOFRAN ODT) 4 MG disintegrating tablet Take 1 tablet (4 mg total) by mouth every 8 (eight) hours as needed for nausea or vomiting. 15 tablet 0    promethazine (PHENERGAN) 25 MG tablet Take 1 tablet (25 mg total) by mouth every 6 (six)  hours as needed for nausea or vomiting. 30 tablet 0     Review of Systems  All other systems reviewed and are negative. Physical Exam   Blood pressure 130/76, pulse (!) 114, temperature 98.4 F (36.9 C), temperature source Oral, resp. rate 16, last menstrual period 11/07/2020, SpO2 97 %.  Physical Exam Vitals and nursing note reviewed.  Constitutional:      General: She is not in acute distress. HENT:     Head: Normocephalic.  Eyes:     Pupils: Pupils are equal, round, and reactive to light.  Cardiovascular:     Rate and Rhythm: Normal rate.  Pulmonary:     Effort: Pulmonary effort is normal.  Abdominal:     Palpations: Abdomen is soft.     Tenderness: There is no abdominal tenderness.  Skin:    General: Skin is warm and dry.  Neurological:     Mental Status: She is alert and oriented to person, place, and time.  Psychiatric:        Behavior: Behavior normal.  Patient tearful  and anxious.   NST:  Baseline: 130 Variability: moderate Accels: 15x15 Decels: none Toco: none Reactive/Appropriate for GA  Results for orders placed or performed during the hospital encounter of 06/14/21 (from the past 24 hour(s))  Glucose, capillary     Status: None   Collection Time: 06/14/21  6:40 PM  Result Value Ref Range   Glucose-Capillary 81 70 - 99 mg/dL      MAU Course  Procedures  MDM Patient reports that she has a history of anxiety and had PPD with her last pregnancy. She is worried about everything with the pregnancy and has limited support at home. Cha[facility]in came to talk with patient and offer emotional support and brief counseling time today. Patient feeling better. She is reassured that blood sugar is normal and baby is reactive on the monitor. She is motivated to seek counseling outpatient. Will give list of resources. Will also have patient see OB Cardiology clinic due to dizziness and shaking that does not appear to be associated with low blood sugar.    Assessment and  Plan   1. Dizziness   2. Postural dizziness with presyncope   3. Anxiety   4. [redacted] weeks gestation of pregnancy   5. History of postpartum depression, currently pregnant in third trimester    DC home List of mental health service providers given  3rd Trimester precautions  PTL precautions  Fetal kick counts RX: amb referral to women's heart clinic  Return to MAU as needed FU with OB as planned   Follow-up Information     [facility] Follow up.   Contact information: [address]  [address] [place] [address] [phone]                Thressa Sheller DNP, CNM  06/14/21  9:14 PM

## 2021-06-14 NOTE — MAU Note (Signed)
Pt reports decreased fetal movement since this morning.   Pt reports her blood sugar has been low today she reports it was 72 when she checked.

## 2021-06-14 NOTE — Discharge Instructions (Signed)
#  1   [facility] Walk-in services available  Monday through Friday  or call for an appointment 8:30 a.m.-12 p.m. and 1 p.m.-2:30 p.m. [phone] [phone] For more information email: [email or web address] [address] [place], [place] [address]   #2   [facility] OUTPATIENT SERVICES Phone: [phone]  Address: [address] [place], [place] [address]  Hours: Open 24/7, No appointment required.

## 2021-06-14 NOTE — Patient Instructions (Signed)
Hospital Bed Configurations for Labor: 1. Throne: raise head all the way up and lower the feet all the way down on the bed to make a  chair and you can sit upright in squatted position in bed. This can be helpful if you have an  epidural to still place pressure on cervix as the baby descends and you can be more comfortable.  A squat bar may be attached to the bed for this position if your hospital has one, this will also  you sit and lean forward onto bar for rest and hold onto bar during contractions.  2. Hands and knees or Kneeling: Recline the head of bed back slightly for you to lean forward on  elbows at the head or over the head, this will also give your knees padding to remain  comfortable. Place pillows under your arms as needed for comfort while leaning forward and to  keep your knees wide throughout. This will allow you rest between contractions and gravity to  help baby descend while opening the bottom of the pelvis.  3. Ball on the bed: You can flatten the bed completely out and bring the birthing ball to you on the  bed, so you don't have to be on the floor. Place the ball in front of you with you on your knees  and you can rock forward/backward or side to side with your upper body and trunk. Or can be  done if you want to stand and have ball for support on bed.  4. Side lying or Supine: Flatten bed out and you can raise you head slightly elevated to comfort.  You can add a peanut ball for support between knees or have your partner raise upper leg as  desired. Pillows can be placed under the belly or low back for comfort.  *Tip: The tray table with pillows may be helpful to rest upper leg on if placed at bedside.  5. Inversion: Lower feet of bed and you can rest your forearms on it for a slight inversion.  6. Leaning Forward: Elevate bed to the height needed for you. Then you can lean onto the bed at  an elevated surface for support. This can also be done with you sitting on a  birthing ball and  leaning forward onto lower bed height. You can place pillows for support for your arms or head  as needed as well.  7. Elevated Lunge: lower the bed all the way down and place a foot on top for a lunge and can hold  onto rail for support.  8. Semi-Reclined: You can raise the head of the bed up high and lean against this with both of your  legs up and knees toward your chest. You can have your feet in stirrups if that is more  comfortable for you and to give you more traction or birthing assistants can hold your legs in  this position. You can still have your knees inward and ankles wider in this position to allow  more room in the lower pelvis and you may have more comfort with rolling a towel into a U  shape for under your sacrum to allow space for movement at the pelvis during labor.  References: [email or web address]  ACTIVE LABOR POSITIONS: 1. At the start of labor at your baby is at the top of your pelvis, you can work on helping the baby  descend lower into the pelvis with positioning your knees  wide/apart and your butt tucked  under you. Positions that help with this can be squatting with knees apart wide, you can have  support from your partner or the hospital bed for example. You can also try leaning forward and  rocking front to back or sitting on a birthing ball rocking. Tip: Holding onto something helps from  getting as tired in this position. 2. If the baby is above pubic bone focus on opening midpelvis. For this you can try using a peanut  ball between your legs and rocking front to back on your side. You can also stand and sway side  to side/ front to back, try rocking side to side on all fours or you can add shifting your hips back  to front one at a time. Don't' forget to switch sides. If you can stand, having one foot on a stool  or higher than the other may improve opening the pelvis as well.  3. If baby is below  pubic bone or lower into the pelvis, you need to open the bottom of the pelvis.  Think "knees in ankles out" can you peanut ball between ankles to promote this position or on  all fours with knees in and ankles apart. If you don't have a peanut ball, your partner can help  with holding your upper leg in sidelying and have your knee lower than your ankle.  4. Remember to try gentle pushing: Breath in to start pushing and breath put as you push  downward. 5. Sidelying, kneeling or on all fours will allow more pelvic space for baby than on your back however do what feels the most comfortable for you and your baby. These positions are  suggestions based on where the baby is positioned during labor and every labor is different.  [email or web address]

## 2021-06-14 NOTE — Therapy (Addendum)
[facility] @ [address] [place], [place], [address] Phone: [phone]   Fax:  [phone]  Physical Therapy Treatment  Patient Details  Name: Evelyn Landry MRN: [id number] Date of Birth: June 29, 1996 Referring Provider (PT): Carlynn Purl [place], [place]   Encounter Date: 06/14/2021   PT End of Session - 06/14/21 1210     Visit Number 2    Date for PT Re-Evaluation 08/22/21    Authorization Type [employer]    Authorization - Number of Visits 8    PT Start Time 2330    PT Stop Time 1230    PT Time Calculation (min) 43 min    Activity Tolerance Patient tolerated treatment well;Patient limited by pain    Behavior During Therapy [facility] for tasks assessed/performed             No past medical history on file.  Past Surgical History:  Procedure Laterality Date   TONSILLECTOMY      There were no vitals filed for this visit.   Subjective Assessment - 06/14/21 1148     Subjective Pt reports her blood sugar has been low today, has been monitoring and called OB as well and she feels tired today. Pt is now 31 weeks. pt reports improvement with pain overall and did have some irritation for tape but felt the tape was helpful for the first few days of wearing it before irritations started. Pt reports she feels is able to do more activity with less pain now and does use belly band to assist as well and after discussion at eval on how to wear she has had improved pain throughout the day.    Pertinent History one other child 10yo will be 5 in december.    How long can you sit comfortably? 2 hours or more    How long can you stand comfortably? reports no limits with use of band    How long can you walk comfortably? greatly improved, tolerating more than 45 mins    Patient Stated Goals to have less pain    Currently in Pain? No/denies                               [facility] Adult PT Treatment/Exercise - 06/14/21  0001       Self-Care   Self-Care Other Self-Care Comments    Other Self-Care Comments  Pt educated on birthing positions and phases of labor with handouts provided.                       PT Short Term Goals - 05/22/21 1154       PT SHORT TERM GOAL #1   Title Pt to be I with HEP    Time 4    Period Weeks    Status New    Target Date 06/19/21      PT SHORT TERM GOAL #2   Title pt to report no more than 5/10 pain with mobility at pelvis or back to improve tolerance to getting in/out of bed    Time 4    Period Weeks    Status New    Target Date 06/19/21               PT Long Term Goals - 05/22/21 1155       PT LONG TERM GOAL #1   Title Pt to be I with  advanced HEP    Time 3    Period Months    Status New    Target Date 08/22/21      PT LONG TERM GOAL #2   Title pt to report no more than 2/10 pain with mobility to improve QOL and improve tolerance to activities    Time 3    Period Months    Status New    Target Date 08/22/21      PT LONG TERM GOAL #3   Title pt to demonstrate improved ability to properly lift 15# to better care for children and decrease risk of back injury and to stabilize pelvis    Time 3    Period Months    Status New    Target Date 08/22/21      PT LONG TERM GOAL #4   Title pt to demonstrate improved bil hip strength to 5/5 for birth prep    Time 3    Period Months    Status New    Target Date 08/22/21                   Plan - 06/14/21 1211     Clinical Impression Statement Pt presents to clinic reporting she had some low blood sugar this morning and increased fetal movements, called MD ate breakfast and blood sugar increased to 110 prior to PT appointment. MD called back and spoke to pt about this and following. Pt reported she felt fatigued this morning and still does today but is starting feeling better with increased blood sugar readings. Pt session focused on birthing positions and hospital bed education to  improve mobility and strength during labor and decrease risk of injury or tearing if possibly as her goal is attempt without pain medication. Pt reports she has felt better overall since eval but this past week has had increased morning sickness and fatigue but not a lot of pain. Pt reported session very helpful in understanding phases of labor and positions to improve top/mid/bottom of pelvis to improve labor outcomes as able.  Pt denied questions and would benefit from PT to address deficits.    Personal Factors and Comorbidities Time since onset of injury/illness/exacerbation;Comorbidity 1    Comorbidities pregnant    Examination-Activity Limitations Locomotion Level;Transfers;Bed Mobility;Sleep;Stairs;Squat;Stand;Lift;Dressing    Examination-Participation Restrictions Community Activity;Shop;Yard Work    Stability/Clinical Decision Making Stable/Uncomplicated    Rehab Potential Good    PT Frequency 1x / week    PT Duration 8 weeks    PT Treatment/Interventions ADLs/Self Care Home Management;Aquatic Therapy;Functional mobility training;Therapeutic activities;Therapeutic exercise;Neuromuscular re-education;Manual techniques;Patient/family education;Taping;Passive range of motion;Energy conservation    PT Next Visit Plan discuss aquatics, go over HEP, stretching    PT Home Exercise Plan [id number]    Consulted and Agree with Plan of Care Patient             Patient will benefit from skilled therapeutic intervention in order to improve the following deficits and impairments:  Decreased coordination, Decreased endurance, Difficulty walking, Decreased activity tolerance, Pain, Impaired flexibility, Improper body mechanics, Postural dysfunction, Decreased strength, Decreased mobility  Visit Diagnosis: Muscle weakness (generalized)  Cramp and spasm  Abnormal posture     Problem List Patient Active Problem List   Diagnosis Date Noted   Class 2 obesity due to excess calories without serious  comorbidity with body mass index (BMI) of 39.0 to 39.9 in adult 02/26/2020   Abnormal uterine bleeding (AUB) 02/26/2020   Goiter 12/25/2013    Hildred Alamin  Athena Masse, PT, DPT 06/15/2211:29 PM    PHYSICAL THERAPY DISCHARGE SUMMARY  Visits from Start of Care: 2  Current functional level related to goals / functional outcomes: Unable to formally reassess as pt has not returned since last appointment (this appointment on 06/14/21).    Remaining deficits: Unable to formally reassess as pt has not returned since last visit   Education / Equipment: HEP   Patient agrees to discharge. Patient goals were partially met. Patient is being discharged due to not returning since the last visit. PT has made multiple attempts to call pt about appointments however due to attendance policy and pt not returning calls or changing appointments, pt unfortunately will be discharged from PT. Thank you for the referral.    [facility] @ [address] [place], [place], [address] Phone: [phone]   Fax:  [phone]  Name: Evelyn Landry MRN: [id number] Date of Birth: October 02, 1995

## 2021-06-14 NOTE — Progress Notes (Signed)
Chaplain responded to page from nurse. Patient dealing with anxiety.  Chaplain met with patient for approximately 45 minutes to listen and offer support.  The patient is not married. She has one son, almost five. Her son's father has had legal trouble and she does not want her son near his father.  They have not spolen in six months and she says he's dangerous. He was arrested for waving a weapon and using threatening language.  Her current boyfriend, who is the father of her unborn daughter, works 12 hour days as a laborer.  They have been together 3 years and are "having problems" and she has offered to leave.  However, she is unemployed (fired from her job due to pregnancy illness) and she has no car and no place to go.  She lacks support from her parents and siblings.  Patient says "I know I need counseling."  Her son is being looked after now, during her hospitalization, by her boyfriend and his brother, who also lives with them.  The patient said she had acted on thought of suicide before the birth of her son 5 years ago.  "I took a lot of pills and drove away, but no one knew.  I came home and slept for 3 days.  "He saved my life." she says, and chaplain assumes that the infant son (now five years)  gave her a new reason to live. She desires to complete college and give her children a better life and feels that she can do this on her own without a partner, if need be.  The patient also is experiencing grief and shame over two children conceived but not born.  The chaplain prayed for her. Before leaving, the chaplain conveyed the patient's needs to the staff. Rev. Virginia Wood Pager [phone]  

## 2021-06-20 ENCOUNTER — Encounter: Payer: Self-pay | Admitting: Cardiology

## 2021-06-20 ENCOUNTER — Ambulatory Visit (INDEPENDENT_AMBULATORY_CARE_PROVIDER_SITE_OTHER): Payer: Medicaid Other | Admitting: Cardiology

## 2021-06-20 ENCOUNTER — Other Ambulatory Visit: Payer: Self-pay

## 2021-06-20 ENCOUNTER — Ambulatory Visit (INDEPENDENT_AMBULATORY_CARE_PROVIDER_SITE_OTHER): Payer: Medicaid Other

## 2021-06-20 VITALS — BP 120/74 | HR 102 | Ht 61.0 in | Wt 207.6 lb

## 2021-06-20 DIAGNOSIS — R55 Syncope and collapse: Secondary | ICD-10-CM

## 2021-06-20 DIAGNOSIS — R42 Dizziness and giddiness: Secondary | ICD-10-CM

## 2021-06-20 DIAGNOSIS — R0602 Shortness of breath: Secondary | ICD-10-CM

## 2021-06-20 DIAGNOSIS — Z79899 Other long term (current) drug therapy: Secondary | ICD-10-CM | POA: Diagnosis not present

## 2021-06-20 NOTE — Progress Notes (Unsigned)
Enrolled patient for a 14 day Zio AT monitor to be mailed to patients home.  

## 2021-06-20 NOTE — Progress Notes (Signed)
Cardio[facility]Obstetrics Clinic  New Evaluation  Date:  06/20/2021   ID:  Evelyn Landry, DOB 14-Jul-1996, MRN [id number]  PCP:  Pcp, No   [facility] Providers Cardiologist:  Berniece Salines, DO  Electrophysiologist:  None       Referring MD: Tresea Mall, CNM   Chief Complaint: " I am having palpitations and angiogram in the pass out"  History of Present Illness:    Evelyn Landry is a 25 y.o. female [[id number]] who is being seen today for the evaluation of palpitations and syncope at the request of Tresea Mall, CNM.  Medical history includes obesity.  Patient reports episode of dizziness and palpitations along with left arm pain last week as she was getting up from the bed. At this time, she was also experiencing vision changes. She went to the ED to get further evaluated and symptoms resolved within a few hours. She had 2 other episodes of similar to this when she was cooking for her soon. Denies chest pain, syncope, history of seizures but endorsing appetite changes as she has been eating less and drinking less water as she has been experiencing worsening nausea and vomiting. Also reports dyspnea but attributes this to pregnancy. Positional changes worsen her symptoms and nothing makes it better other than they spontaneously resolve. She has one 37 year old son who was born term, has had a miscarriage and an abortion. Currently [redacted] weeks gestation.     Prior CV Studies Reviewed: The following studies were reviewed today:   No past medical history on file.  Past Surgical History:  Procedure Laterality Date   TONSILLECTOMY        OB History     Gravida  4   Para  1   Term  1   Preterm      AB  2   Living  1      SAB  1   IAB  1   Ectopic      Multiple      Live Births  1               Current Medications: Current Meds  Medication Sig   famotidine (PEPCID) 20 MG tablet Take 1 tablet (20 mg total) by mouth at bedtime.   promethazine  (PHENERGAN) 25 MG tablet Take 1 tablet (25 mg total) by mouth every 6 (six) hours as needed for nausea or vomiting.     Allergies:   Patient has no known allergies.   Social History   Socioeconomic History   Marital status: Single    Spouse name: Not on file   Number of children: Not on file   Years of education: Not on file   Highest education level: Not on file  Occupational History   Not on file  Tobacco Use   Smoking status: Never   Smokeless tobacco: Never  Substance and Sexual Activity   Alcohol use: Not Currently   Drug use: Never   Sexual activity: Yes  Other Topics Concern   Not on file  Social History Narrative   Not on file   Social Determinants of Health   Financial Resource Strain: Not on file  Food Insecurity: No Food Insecurity   Worried About Running Out of Food in the Last Year: Never true   [place] in the Last Year: Never true  Transportation Needs: No Transportation Needs   Lack of Transportation (Medical): No   Lack of Transportation (Non-Medical): No  Physical  Activity: Not on file  Stress: Not on file  Social Connections: Not on file      No family history on file.    ROS:   Please see the history of present illness.     Review of Systems  Constitution: Negative for decreased appetite, fever and weight gain.  HENT: Negative for congestion, ear discharge, hoarse voice and sore throat.   Eyes: Negative for discharge, redness, vision loss in right eye and visual halos.  Cardiovascular: Report palpitation.  Negative for chest pain, dyspnea on exertion, leg swelling, orthopnea. Respiratory: Negative for cough, hemoptysis, shortness of breath and snoring.   Endocrine: Negative for heat intolerance and polyphagia.  Hematologic/Lymphatic: Negative for bleeding problem. Does not bruise/bleed easily.  Skin: Negative for flushing, nail changes, rash and suspicious lesions.  Musculoskeletal: Negative for arthritis, joint pain, muscle cramps,  myalgias, neck pain and stiffness.  Gastrointestinal: Negative for abdominal pain, bowel incontinence, diarrhea and excessive appetite.  Genitourinary: Negative for decreased libido, genital sores and incomplete emptying.  Neurological: Negative for brief paralysis, focal weakness, headaches and loss of balance.  Psychiatric/Behavioral: Negative for altered mental status, depression and suicidal ideas.  Allergic/Immunologic: Negative for HIV exposure and persistent infections.     Labs/EKG Reviewed:    EKG:   EKG is was ordered today.  The ekg ordered today demonstrates sinus rhythm HR   Recent Labs: 01/09/2021: ALT 22; BUN 9; Creatinine, Ser 0.66; Hemoglobin 13.6; Platelets 275; Potassium 3.7; Sodium 136   Recent Lipid Panel No results found for: CHOL, TRIG, HDL, CHOLHDL, LDLCALC, LDLDIRECT  Physical Exam:    VS:  BP 120/74   Pulse (!) 102   Ht 5' 1"  (1.549 m)   Wt 207 lb 9.6 oz (94.2 kg)   LMP 11/07/2020 (Approximate)   SpO2 99%   BMI 39.23 kg/m     Wt Readings from Last 3 Encounters:  06/20/21 207 lb 9.6 oz (94.2 kg)  01/30/21 202 lb 4.8 oz (91.8 kg)  01/09/21 207 lb 3.2 oz (94 kg)     GEN: Well nourished, well developed in no acute distress HEENT: Normal NECK: No JVD; No carotid bruits LYMPHATICS: No lymphadenopathy CARDIAC: RRR, 1/6 systolic murmurs, rubs, gallops RESPIRATORY:  Clear to auscultation without rales, wheezing or rhonchi  ABDOMEN: Soft, non-tender, non-distended MUSCULOSKELETAL:  No edema; No deformity  SKIN: Warm and dry NEUROLOGIC:  Alert and oriented x 3 PSYCHIATRIC:  Normal affect    Risk Assessment/Risk Calculators:     CARPREG II Risk Prediction Index Score:  1.  The patient's risk for a primary cardiac event is 5%.   Modified World Health Organization [facility]) Classification of Maternal CV Risk   Class I         ASSESSMENT & PLAN:     Palpitations Shortness of breath   Her shortness of breath appears to be out of proportion to  her current gestational age I like to do is get blood work to make sure that anemia is not playing a role her last hemoglobin was was normal but I like to repeat this to make sure that anemia is not playing a role here.  We will get blood work for her electrolytes as well as her kidney function for An echocardiogram will be done as well to assess her LV function to make sure that cardiomyopathy or any other structural abnormality is not playing a role with her shortness of breath as well as her presyncope episodes. I will place a ZIO monitor on  the patient to make sure that her palpitations in presyncope episodes are not related to any underlying arrhythmia. Heart murmur is her normal physiology of pregnancy murmur and I explained this to the patient.    Patient Instructions  Medication Instructions:  Your physician recommends that you continue on your current medications as directed. Please refer to the Current Medication list given to you today.  *If you need a refill on your cardiac medications before your next appointment, please call your pharmacy*   Lab Work: Your physician recommends that you return for lab work in:  TODAY: BMET, [place], CBC If you have labs (blood work) drawn today and your tests are completely normal, you will receive your results only by: MyChart Message (if you have MyChart) OR A paper copy in the mail If you have any lab test that is abnormal or we need to change your treatment, we will call you to review the results.   Testing/Procedures: ZIO AT Long term monitor-Live Telemetry  Your physician has requested you wear a ZIO patch monitor for 14 days.  This is a single patch monitor. [employer] supplies one patch monitor per enrollment. Additional  stickers are not available.  Please do not apply patch if you will be having a Nuclear Stress Test, Echocardiogram, Cardiac CT, MRI,  or Chest Xray during the period you would be wearing the monitor. The patch cannot be  worn during  these tests. You cannot remove and re-apply the ZIO AT patch monitor.  Your ZIO patch monitor will be mailed 3 day USPS to your address on file. It may take 3-5 days to  receive your monitor after you have been enrolled.  Once you have received your monitor, please review the enclosed instructions. Your monitor has  already been registered assigning a specific monitor serial # to you.   Billing and Patient Assistance Program information  Theodore Demark has been supplied with any insurance information on record for billing. [employer] offers a sliding scale Patient Assistance Program for patients without insurance, or whose  insurance does not completely cover the cost of the ZIO patch monitor. You must apply for the  Patient Assistance Program to qualify for the discounted rate. To apply, call [employer] at [phone],  select option 4, select option 2 , ask to apply for the Patient Assistance Program, (you can request an  interpreter if needed). [employer] will ask your household income and how many people are in your  household. [employer] will quote your out-of-pocket cost based on this information. They will also be able  to set up a 12 month interest free payment plan if needed.  Applying the monitor   Shave hair from upper left chest.  Hold the abrader disc by orange tab. Rub the abrader in 40 strokes over left upper chest as indicated in  your monitor instructions.  Clean area with 4 enclosed alcohol pads. Use all pads to ensure the area is cleaned thoroughly. Let  dry.  Apply patch as indicated in monitor instructions. Patch will be placed under collarbone on left side of  chest with arrow pointing upward.  Rub patch adhesive wings for 2 minutes. Remove the white label marked "1". Remove the white label  marked "2". Rub patch adhesive wings for 2 additional minutes.  While looking in a mirror, press and release button in center of patch. A small green light will flash 3-4  times.  This will be your only indicator that the monitor has been turned on.  Do  not shower for the first 24 hours. You may shower after the first 24 hours.  Press the button if you feel a symptom. You will hear a small click. Record Date, Time and Symptom in  the Patient Log.   Starting the Gateway  In your kit there is a Hydrographic surveyor box the size of a cellphone. This is Airline pilot. It transmits all your  recorded data to [facility]. This box must always stay within 10 feet of you. Open the box and push the *  button. There will be a light that blinks orange and then green a few times. When the light stops  blinking, the Gateway is connected to the ZIO patch. Call [employer] at [phone] to confirm your monitor is transmitting.  Returning your monitor  Remove your patch and place it inside the [place]. In the lower half of the Gateway there is a white  bag with prepaid postage on it. Place Gateway in bag and seal. Mail package back to [place] as soon as  possible. Your physician should have your final report approximately 7 days after you have mailed back  your monitor. Call [place] at [phone] if you have questions regarding your ZIO AT  patch monitor. Call them immediately if you see an orange light blinking on your monitor.  If your monitor falls off in less than 4 days, contact our Monitor department at [phone]. If your  monitor becomes loose or falls off after 4 days call [employer] at [phone] for suggestions on  securing your monitor    Follow-Up: At [facility], you and your health needs are our priority.  As part of our continuing mission to provide you with exceptional heart care, we have created designated Provider Care Teams.  These Care Teams include your primary Cardiologist (physician) and Advanced Practice Providers (APPs -  Physician Assistants and Nurse Practitioners) who all work together to provide you with the care you need, when  you need it.  We recommend signing up for the patient portal called "MyChart".  Sign up information is provided on this After Visit Summary.  MyChart is used to connect with patients for Virtual Visits (Telemedicine).  Patients are able to view lab/test results, encounter notes, upcoming appointments, etc.  Non-urgent messages can be sent to your provider as well.   To learn more about what you can do with MyChart, go to [email or web address].    Your next appointment:   3 month(s)  The format for your next appointment:   In Person  Provider:   Godfrey Pick Rj Pedrosa: DO    Other Instructions Echocardiogram An echocardiogram is a test that uses sound waves (ultrasound) to produce images of the heart. Images from an echocardiogram can provide important information about: Heart size and shape. The size and thickness and movement of your heart's walls. Heart muscle function and strength. Heart valve function or if you have stenosis. Stenosis is when the heart valves are too narrow. If blood is flowing backward through the heart valves (regurgitation). A tumor or infectious growth around the heart valves. Areas of heart muscle that are not working well because of poor blood flow or injury from a heart attack. Aneurysm detection. An aneurysm is a weak or damaged part of an artery wall. The wall bulges out from the normal force of blood pumping through the body. Tell a health care provider about: Any allergies you have. All medicines you are taking, including vitamins, herbs, eye drops, creams, and over-the-counter medicines.  Any blood disorders you have. Any surgeries you have had. Any medical conditions you have. Whether you are pregnant or may be pregnant. What are the risks? Generally, this is a safe test. However, problems may occur, including an allergic reaction to dye (contrast) that may be used during the test. What happens before the test? No specific preparation is needed. You may  eat and drink normally. What happens during the test?  You will take off your clothes from the waist up and put on a hospital gown. Electrodes or electrocardiogram (ECG)patches may be placed on your chest. The electrodes or patches are then connected to a device that monitors your heart rate and rhythm. You will lie down on a table for an ultrasound exam. A gel will be applied to your chest to help sound waves pass through your skin. A handheld device, called a transducer, will be pressed against your chest and moved over your heart. The transducer produces sound waves that travel to your heart and bounce back (or "echo" back) to the transducer. These sound waves will be captured in real-time and changed into images of your heart that can be viewed on a video monitor. The images will be recorded on a computer and reviewed by your health care provider. You may be asked to change positions or hold your breath for a short time. This makes it easier to get different views or better views of your heart. In some cases, you may receive contrast through an IV in one of your veins. This can improve the quality of the pictures from your heart. The procedure may vary among health care providers and hospitals. What can I expect after the test? You may return to your normal, everyday life, including diet, activities, and medicines, unless your health care provider tells you not to do that. Follow these instructions at home: It is up to you to get the results of your test. Ask your health care provider, or the department that is doing the test, when your results will be ready. Keep all follow-up visits. This is important. Summary An echocardiogram is a test that uses sound waves (ultrasound) to produce images of the heart. Images from an echocardiogram can provide important information about the size and shape of your heart, heart muscle function, heart valve function, and other possible heart problems. You do  not need to do anything to prepare before this test. You may eat and drink normally. After the echocardiogram is completed, you may return to your normal, everyday life, unless your health care provider tells you not to do that. This information is not intended to replace advice given to you by your health care provider. Make sure you discuss any questions you have with your health care provider. Document Revised: 04/05/2021 Document Reviewed: 03/15/2020 Elsevier Patient Education  [place]:  No follow-ups on file.   Medication Adjustments/Labs and Tests Ordered: Current medicines are reviewed at length with the patient today.  Concerns regarding medicines are outlined above.  Tests Ordered: Orders Placed This Encounter  Procedures   Basic Metabolic Panel (BMET)   CBC with Differential/Platelet   Magnesium   LONG TERM MONITOR-LIVE TELEMETRY (3-14 DAYS)   EKG 12-Lead   ECHOCARDIOGRAM COMPLETE    Medication Changes: No orders of the defined types were placed in this encounter.

## 2021-06-20 NOTE — Patient Instructions (Signed)
Medication Instructions:  Your physician recommends that you continue on your current medications as directed. Please refer to the Current Medication list given to you today.  *If you need a refill on your cardiac medications before your next appointment, please call your pharmacy*   Lab Work: Your physician recommends that you return for lab work in:  TODAY: BMET, [place], CBC If you have labs (blood work) drawn today and your tests are completely normal, you will receive your results only by: MyChart Message (if you have MyChart) OR A paper copy in the mail If you have any lab test that is abnormal or we need to change your treatment, we will call you to review the results.   Testing/Procedures: ZIO AT Long term monitor-Live Telemetry  Your physician has requested you wear a ZIO patch monitor for 14 days.  This is a single patch monitor. [employer] supplies one patch monitor per enrollment. Additional  stickers are not available.  Please do not apply patch if you will be having a Nuclear Stress Test, Echocardiogram, Cardiac CT, MRI,  or Chest Xray during the period you would be wearing the monitor. The patch cannot be worn during  these tests. You cannot remove and re-apply the ZIO AT patch monitor.  Your ZIO patch monitor will be mailed 3 day USPS to your address on file. It may take 3-5 days to  receive your monitor after you have been enrolled.  Once you have received your monitor, please review the enclosed instructions. Your monitor has  already been registered assigning a specific monitor serial # to you.   Billing and Patient Assistance Program information  Evelyn Landry has been supplied with any insurance information on record for billing. [employer] offers a sliding scale Patient Assistance Program for patients without insurance, or whose  insurance does not completely cover the cost of the ZIO patch monitor. You must apply for the  Patient Assistance Program to qualify for the  discounted rate. To apply, call [employer] at [phone],  select option 4, select option 2 , ask to apply for the Patient Assistance Program, (you can request an  interpreter if needed). [employer] will ask your household income and how many people are in your  household. [employer] will quote your out-of-pocket cost based on this information. They will also be able  to set up a 12 month interest free payment plan if needed.  Applying the monitor   Shave hair from upper left chest.  Hold the abrader disc by orange tab. Rub the abrader in 40 strokes over left upper chest as indicated in  your monitor instructions.  Clean area with 4 enclosed alcohol pads. Use all pads to ensure the area is cleaned thoroughly. Let  dry.  Apply patch as indicated in monitor instructions. Patch will be placed under collarbone on left side of  chest with arrow pointing upward.  Rub patch adhesive wings for 2 minutes. Remove the white label marked "1". Remove the white label  marked "2". Rub patch adhesive wings for 2 additional minutes.  While looking in a mirror, press and release button in center of patch. A small green light will flash 3-4  times. This will be your only indicator that the monitor has been turned on.  Do not shower for the first 24 hours. You may shower after the first 24 hours.  Press the button if you feel a symptom. You will hear a small click. Record Date, Time and Symptom in  the Patient Log.  Starting the Gateway  In your kit there is a Hydrographic surveyor box the size of a cellphone. This is Airline pilot. It transmits all your  recorded data to [facility]. This box must always stay within 10 feet of you. Open the box and push the *  button. There will be a light that blinks orange and then green a few times. When the light stops  blinking, the Gateway is connected to the ZIO patch. Call [employer] at [phone] to confirm your monitor is transmitting.  Returning your monitor  Remove your patch  and place it inside the [place]. In the lower half of the Gateway there is a white  bag with prepaid postage on it. Place Gateway in bag and seal. Mail package back to [place] as soon as  possible. Your physician should have your final report approximately 7 days after you have mailed back  your monitor. Call [place] at [phone] if you have questions regarding your ZIO AT  patch monitor. Call them immediately if you see an orange light blinking on your monitor.  If your monitor falls off in less than 4 days, contact our Monitor department at [phone]. If your  monitor becomes loose or falls off after 4 days call [employer] at [phone] for suggestions on  securing your monitor    Follow-Up: At [facility], you and your health needs are our priority.  As part of our continuing mission to provide you with exceptional heart care, we have created designated Provider Care Teams.  These Care Teams include your primary Cardiologist (physician) and Advanced Practice Providers (APPs -  Physician Assistants and Nurse Practitioners) who all work together to provide you with the care you need, when you need it.  We recommend signing up for the patient portal called "MyChart".  Sign up information is provided on this After Visit Summary.  MyChart is used to connect with patients for Virtual Visits (Telemedicine).  Patients are able to view lab/test results, encounter notes, upcoming appointments, etc.  Non-urgent messages can be sent to your provider as well.   To learn more about what you can do with MyChart, go to [email or web address].    Your next appointment:   3 month(s)  The format for your next appointment:   In Person  Provider:   Godfrey Pick Tobb: DO    Other Instructions Echocardiogram An echocardiogram is a test that uses sound waves (ultrasound) to produce images of the heart. Images from an echocardiogram can provide important information  about: Heart size and shape. The size and thickness and movement of your heart's walls. Heart muscle function and strength. Heart valve function or if you have stenosis. Stenosis is when the heart valves are too narrow. If blood is flowing backward through the heart valves (regurgitation). A tumor or infectious growth around the heart valves. Areas of heart muscle that are not working well because of poor blood flow or injury from a heart attack. Aneurysm detection. An aneurysm is a weak or damaged part of an artery wall. The wall bulges out from the normal force of blood pumping through the body. Tell a health care provider about: Any allergies you have. All medicines you are taking, including vitamins, herbs, eye drops, creams, and over-the-counter medicines. Any blood disorders you have. Any surgeries you have had. Any medical conditions you have. Whether you are pregnant or may be pregnant. What are the risks? Generally, this is a safe test. However, problems may occur, including an allergic reaction  to dye (contrast) that may be used during the test. What happens before the test? No specific preparation is needed. You may eat and drink normally. What happens during the test?  You will take off your clothes from the waist up and put on a hospital gown. Electrodes or electrocardiogram (ECG)patches may be placed on your chest. The electrodes or patches are then connected to a device that monitors your heart rate and rhythm. You will lie down on a table for an ultrasound exam. A gel will be applied to your chest to help sound waves pass through your skin. A handheld device, called a transducer, will be pressed against your chest and moved over your heart. The transducer produces sound waves that travel to your heart and bounce back (or "echo" back) to the transducer. These sound waves will be captured in real-time and changed into images of your heart that can be viewed on a video monitor.  The images will be recorded on a computer and reviewed by your health care provider. You may be asked to change positions or hold your breath for a short time. This makes it easier to get different views or better views of your heart. In some cases, you may receive contrast through an IV in one of your veins. This can improve the quality of the pictures from your heart. The procedure may vary among health care providers and hospitals. What can I expect after the test? You may return to your normal, everyday life, including diet, activities, and medicines, unless your health care provider tells you not to do that. Follow these instructions at home: It is up to you to get the results of your test. Ask your health care provider, or the department that is doing the test, when your results will be ready. Keep all follow-up visits. This is important. Summary An echocardiogram is a test that uses sound waves (ultrasound) to produce images of the heart. Images from an echocardiogram can provide important information about the size and shape of your heart, heart muscle function, heart valve function, and other possible heart problems. You do not need to do anything to prepare before this test. You may eat and drink normally. After the echocardiogram is completed, you may return to your normal, everyday life, unless your health care provider tells you not to do that. This information is not intended to replace advice given to you by your health care provider. Make sure you discuss any questions you have with your health care provider. Document Revised: 04/05/2021 Document Reviewed: 03/15/2020 Elsevier Patient Education  2022 [employer].

## 2021-06-21 ENCOUNTER — Other Ambulatory Visit (HOSPITAL_COMMUNITY): Payer: Medicaid Other

## 2021-06-21 ENCOUNTER — Encounter: Payer: Medicaid Other | Admitting: Physical Therapy

## 2021-06-21 LAB — CBC WITH DIFFERENTIAL/PLATELET
Basophils Absolute: 0 10*3/uL (ref 0.0–0.2)
Basos: 0 %
EOS (ABSOLUTE): 0 10*3/uL (ref 0.0–0.4)
Eos: 0 %
Hematocrit: 30.2 % — ABNORMAL LOW (ref 34.0–46.6)
Hemoglobin: 10.2 g/dL — ABNORMAL LOW (ref 11.1–15.9)
Immature Grans (Abs): 0.1 10*3/uL (ref 0.0–0.1)
Immature Granulocytes: 1 %
Lymphocytes Absolute: 2 10*3/uL (ref 0.7–3.1)
Lymphs: 20 %
MCH: 31.5 pg (ref 26.6–33.0)
MCHC: 33.8 g/dL (ref 31.5–35.7)
MCV: 93 fL (ref 79–97)
Monocytes Absolute: 0.6 10*3/uL (ref 0.1–0.9)
Monocytes: 6 %
Neutrophils Absolute: 7.3 10*3/uL — ABNORMAL HIGH (ref 1.4–7.0)
Neutrophils: 73 %
Platelets: 258 10*3/uL (ref 150–450)
RBC: 3.24 x10E6/uL — ABNORMAL LOW (ref 3.77–5.28)
RDW: 12.1 % (ref 11.7–15.4)
WBC: 10.2 10*3/uL (ref 3.4–10.8)

## 2021-06-21 LAB — MAGNESIUM: Magnesium: 1.7 mg/dL (ref 1.6–2.3)

## 2021-06-21 LAB — BASIC METABOLIC PANEL
BUN/Creatinine Ratio: 10 (ref 9–23)
BUN: 5 mg/dL — ABNORMAL LOW (ref 6–20)
CO2: 21 mmol/L (ref 20–29)
Calcium: 9.4 mg/dL (ref 8.7–10.2)
Chloride: 105 mmol/L (ref 96–106)
Creatinine, Ser: 0.51 mg/dL — ABNORMAL LOW (ref 0.57–1.00)
Glucose: 73 mg/dL (ref 70–99)
Potassium: 3.2 mmol/L — ABNORMAL LOW (ref 3.5–5.2)
Sodium: 142 mmol/L (ref 134–144)
eGFR: 131 mL/min/{1.73_m2} (ref >59)

## 2021-06-22 ENCOUNTER — Telehealth: Payer: Self-pay | Admitting: Cardiology

## 2021-06-22 DIAGNOSIS — E876 Hypokalemia: Secondary | ICD-10-CM

## 2021-06-22 MED ORDER — POTASSIUM CHLORIDE CRYS ER 20 MEQ PO TBCR
EXTENDED_RELEASE_TABLET | ORAL | 0 refills | Status: DC
Start: 2021-06-22 — End: 2021-08-10

## 2021-06-22 NOTE — Telephone Encounter (Signed)
Spoke with pt and advised per Dr Servando Salina: Your hemoglobin is low 10.2.  Please contact your OB GYN as she has symptoms with the shortness of breath could also be symptomatic anemia.  I am still waiting on the lab work for your kidney function.  Once I get that information we will let you know as well. Pt verbalizes understanding and agrees with current plan.

## 2021-06-22 NOTE — Telephone Encounter (Signed)
Spoke with pt, aware of the recommendations New script sent to the pharmacy  Lab orders mailed to the pt

## 2021-06-22 NOTE — Telephone Encounter (Signed)
Patient returned call for lab results.  

## 2021-06-27 ENCOUNTER — Other Ambulatory Visit: Payer: Self-pay

## 2021-06-27 ENCOUNTER — Telehealth: Payer: Self-pay | Admitting: Physical Therapy

## 2021-06-27 ENCOUNTER — Ambulatory Visit (HOSPITAL_COMMUNITY): Payer: Medicaid Other | Attending: Internal Medicine

## 2021-06-27 ENCOUNTER — Other Ambulatory Visit (HOSPITAL_COMMUNITY): Payer: Medicaid Other

## 2021-06-27 ENCOUNTER — Ambulatory Visit: Payer: Medicaid Other | Admitting: Physical Therapy

## 2021-06-27 DIAGNOSIS — R0602 Shortness of breath: Secondary | ICD-10-CM | POA: Diagnosis present

## 2021-06-27 DIAGNOSIS — R42 Dizziness and giddiness: Secondary | ICD-10-CM | POA: Diagnosis present

## 2021-06-27 DIAGNOSIS — R55 Syncope and collapse: Secondary | ICD-10-CM | POA: Insufficient documentation

## 2021-06-27 LAB — ECHOCARDIOGRAM COMPLETE
AR max vel: 2.39 cm2
AV Area VTI: 2.53 cm2
AV Area mean vel: 2.38 cm2
AV Mean grad: 5 mmHg
AV Peak grad: 8.8 mmHg
Ao pk vel: 1.49 m/s
Area-P 1/2: 4.96 cm2
S' Lateral: 3.15 cm

## 2021-06-27 NOTE — Telephone Encounter (Signed)
PT called pt about this morning's appointment at 1145am. Pt did not answer, voicemail left.   Otelia Sergeant, PT, DPT 06/28/2211:15 PM

## 2021-06-28 DIAGNOSIS — R0602 Shortness of breath: Secondary | ICD-10-CM | POA: Diagnosis not present

## 2021-06-28 DIAGNOSIS — R42 Dizziness and giddiness: Secondary | ICD-10-CM

## 2021-06-28 DIAGNOSIS — R55 Syncope and collapse: Secondary | ICD-10-CM | POA: Diagnosis not present

## 2021-07-05 ENCOUNTER — Encounter: Payer: Medicaid Other | Admitting: Physical Therapy

## 2021-07-12 ENCOUNTER — Telehealth: Payer: Self-pay | Admitting: Physical Therapy

## 2021-07-12 ENCOUNTER — Ambulatory Visit: Payer: Medicaid Other | Attending: Obstetrics and Gynecology | Admitting: Physical Therapy

## 2021-07-12 DIAGNOSIS — R252 Cramp and spasm: Secondary | ICD-10-CM | POA: Insufficient documentation

## 2021-07-12 DIAGNOSIS — M6281 Muscle weakness (generalized): Secondary | ICD-10-CM | POA: Insufficient documentation

## 2021-07-12 DIAGNOSIS — R293 Abnormal posture: Secondary | ICD-10-CM | POA: Insufficient documentation

## 2021-07-12 NOTE — Telephone Encounter (Signed)
PT called pt about this morning's appointment at 1145. Pt did not answer, voicemail left.   Otelia Sergeant, PT, DPT 07/13/2211:17 PM

## 2021-07-19 ENCOUNTER — Ambulatory Visit: Payer: Medicaid Other | Admitting: Physical Therapy

## 2021-07-19 ENCOUNTER — Telehealth: Payer: Self-pay | Admitting: Physical Therapy

## 2021-07-19 NOTE — Telephone Encounter (Signed)
PT called pt about this morning's appointment at 1145. Pt did not answer, voicemail left. Unfortunately, this is pt's third missed appointment without calling in a row and due to clinic's attendance policy pt will need to be discharged from PT at this time.   Otelia Sergeant, PT, DPT 07/20/2211:11 PM

## 2021-07-26 ENCOUNTER — Encounter: Payer: Medicaid Other | Admitting: Physical Therapy

## 2021-07-30 ENCOUNTER — Other Ambulatory Visit: Payer: Self-pay

## 2021-07-31 ENCOUNTER — Telehealth (HOSPITAL_COMMUNITY): Payer: Self-pay | Admitting: *Deleted

## 2021-07-31 ENCOUNTER — Inpatient Hospital Stay (HOSPITAL_COMMUNITY)
Admission: AD | Admit: 2021-07-31 | Discharge: 2021-07-31 | Disposition: A | Discharge status: Home / Self Care | Payer: Medicaid Other | Attending: Obstetrics and Gynecology | Admitting: Obstetrics and Gynecology

## 2021-07-31 ENCOUNTER — Encounter (HOSPITAL_COMMUNITY): Payer: Self-pay | Admitting: Obstetrics and Gynecology

## 2021-07-31 DIAGNOSIS — O471 False labor at or after 37 completed weeks of gestation: Secondary | ICD-10-CM | POA: Diagnosis present

## 2021-07-31 DIAGNOSIS — Z3A38 38 weeks gestation of pregnancy: Secondary | ICD-10-CM | POA: Insufficient documentation

## 2021-07-31 DIAGNOSIS — O26893 Other specified pregnancy related conditions, third trimester: Secondary | ICD-10-CM

## 2021-07-31 DIAGNOSIS — N898 Other specified noninflammatory disorders of vagina: Secondary | ICD-10-CM | POA: Diagnosis not present

## 2021-07-31 NOTE — Telephone Encounter (Signed)
Preadmission screen  

## 2021-07-31 NOTE — MAU Provider Note (Signed)
History    [id number]  Arrival date and time: 07/31/21 0000  Chief Complaint  Patient presents with   Rupture of Membranes   HPI Evelyn Landry is a 25 y.o. at [redacted]w[redacted]d who presents to MAU with concern for ruptured membranes. Patient states that she felt a gush of fluid around 2130. States that it was clear and that she felt trickling and another gush afterwards. She has had irregular contractions for the last few days and these have not changed in nature since this event. She is feeling baby move well. She has not had vaginal bleeding. She has no other concerns at this time.   Vaginal bleeding: No LOF: Yes Fetal Movement: Yes Contractions: Yes, here and there, nothing consistent  OB History     Gravida  4   Para  1   Term  1   Preterm      AB  2   Living  1      SAB  1   IAB  1   Ectopic      Multiple      Live Births  1           History reviewed. No pertinent past medical history.  Past Surgical History:  Procedure Laterality Date   TONSILLECTOMY      History reviewed. No pertinent family history.  Social History   Socioeconomic History   Marital status: Single    Spouse name: Not on file   Number of children: Not on file   Years of education: Not on file   Highest education level: Not on file  Occupational History   Not on file  Tobacco Use   Smoking status: Never   Smokeless tobacco: Never  Vaping Use   Vaping Use: Never used  Substance and Sexual Activity   Alcohol use: Not Currently   Drug use: Never   Sexual activity: Not Currently  Other Topics Concern   Not on file  Social History Narrative   Not on file   Social Determinants of Health   Financial Resource Strain: Not on file  Food Insecurity: No Food Insecurity   Worried About Running Out of Food in the Last Year: Never true   Ran Out of Food in the Last Year: Never true  Transportation Needs: No Transportation Needs   Lack of Transportation (Medical): No   Lack of  Transportation (Non-Medical): No  Physical Activity: Not on file  Stress: Not on file  Social Connections: Not on file  Intimate Partner Violence: Not on file    No Known Allergies  No current facility-administered medications on file prior to encounter.   Current Outpatient Medications on File Prior to Encounter  Medication Sig Dispense Refill   famotidine (PEPCID) 20 MG tablet Take 1 tablet (20 mg total) by mouth at bedtime. 30 tablet 0   ondansetron (ZOFRAN ODT) 4 MG disintegrating tablet Take 1 tablet (4 mg total) by mouth every 8 (eight) hours as needed for nausea or vomiting. (Patient not taking: Reported on 06/20/2021) 15 tablet 0   potassium chloride SA (KLOR-CON) 20 MEQ tablet Take 3 tablets once 3 tablet 0   promethazine (PHENERGAN) 25 MG tablet Take 1 tablet (25 mg total) by mouth every 6 (six) hours as needed for nausea or vomiting. 30 tablet 0    ROS Pertinent positives and negative per HPI, all others reviewed and negative.  Physical Exam   BP 120/75 (BP Location: Right Arm)    Pulse 96  Temp 99.2 F (37.3 C) (Oral)    Resp 18    Ht 5\' 2"  (1.575 m)    Wt 94.6 kg    LMP 11/07/2020 (Approximate)    SpO2 100%    BMI 38.15 kg/m   Physical Exam Exam conducted with a chaperone present.  Constitutional:      General: She is not in acute distress.    Appearance: Normal appearance.  HENT:     Head: Normocephalic and atraumatic.     Mouth/Throat:     Mouth: Mucous membranes are moist.  Eyes:     Extraocular Movements: Extraocular movements intact.     Conjunctiva/sclera: Conjunctivae normal.  Cardiovascular:     Rate and Rhythm: Normal rate.  Pulmonary:     Effort: Pulmonary effort is normal.  Abdominal:     Palpations: Abdomen is soft.     Tenderness: There is no abdominal tenderness.     Comments: Gravid  Genitourinary:    General: Normal vulva.     Comments: Normal vaginal discharge present, normal cervix that is visually closed, no pooling  noted Musculoskeletal:        General: Normal range of motion.     Right lower leg: No edema.     Left lower leg: No edema.  Skin:    General: Skin is warm and dry.  Neurological:     General: No focal deficit present.     Mental Status: She is alert and oriented to person, place, and time.  Psychiatric:        Mood and Affect: Mood normal.        Behavior: Behavior normal.   FHT Baseline 125 bpm, moderate variability, 15x15 accels, no decels Toco: Irregular contractions, uterine irritability  Cat: 1, reactive   Labs No results found for this or any previous visit (from the past 24 hour(s)).  Imaging No results found.  MAU Course  Procedures Lab Orders  No laboratory test(s) ordered today   No orders of the defined types were placed in this encounter.  Imaging Orders  No imaging studies ordered today   MDM Patient presents for leaking of fluid SSE performed, no pooling noted, cervix visually closed  Fern test obtained and negative  Cat 1, reactive tracing  Tocometer with irregular contractions  Assessment and Plan   1. Vaginal discharge during pregnancy in third trimester  - Stable for discharge home with return labor precautions    - Patient voiced understanding and is agreeable to plan   01/07/2021, MD

## 2021-07-31 NOTE — MAU Note (Addendum)
Pt reports to MAU c/o a gush of fluid around 2130 that "soaked my underwear" states it was clear fluid with no odor. Pt states she laid down afterwards and when she stood up around 2220 she noticed a trickle of fluid. Says "I went to take a shower and felt another gush in the shower" reports "very irregular" ctx since 12/21. +FM. Denies any VB.   3/10 pain score.

## 2021-08-01 ENCOUNTER — Telehealth (HOSPITAL_COMMUNITY): Payer: Self-pay | Admitting: *Deleted

## 2021-08-01 ENCOUNTER — Encounter (HOSPITAL_COMMUNITY): Payer: Self-pay | Admitting: *Deleted

## 2021-08-01 NOTE — Telephone Encounter (Signed)
Preadmission screen  

## 2021-08-04 ENCOUNTER — Encounter (HOSPITAL_COMMUNITY): Payer: Self-pay | Admitting: Obstetrics and Gynecology

## 2021-08-06 NOTE — L&D Delivery Note (Signed)
Delivery Note She entered active labor and began to progress quickly.  For about 45 minutes prior to delivery she had variable decels with most ctx, maintained mod variability between ctx.  I was able to reduce cervix from a rim to complete and she pushed well.  At 5:28 AM a viable female was delivered via Vaginal, Spontaneous (Presentation: Left Occiput Anterior).  APGAR: 9, 9; weight pending.   Placenta status: Spontaneous, Intact.  Cord: 3 vessels with the following complications: tight nuchal x 1 delivered through.   Anesthesia: Epidural Episiotomy: None Lacerations: left Labial Suture Repair:  none Est. Blood Loss (mL):  200  Mom to postpartum.  Baby to Couplet care / Skin to Skin.  Evelyn Landry Evelyn Landry 08/09/2021, 5:45 AM

## 2021-08-07 ENCOUNTER — Other Ambulatory Visit: Payer: Self-pay | Admitting: Obstetrics and Gynecology

## 2021-08-08 ENCOUNTER — Inpatient Hospital Stay (HOSPITAL_COMMUNITY): Payer: Medicaid Other | Admitting: Anesthesiology

## 2021-08-08 ENCOUNTER — Inpatient Hospital Stay (HOSPITAL_COMMUNITY): Payer: Medicaid Other

## 2021-08-08 ENCOUNTER — Inpatient Hospital Stay (HOSPITAL_COMMUNITY)
Admission: AD | Admit: 2021-08-08 | Discharge: 2021-08-10 | DRG: 807 | Disposition: A | Discharge status: Home / Self Care | Payer: Medicaid Other | Attending: Obstetrics and Gynecology | Admitting: Obstetrics and Gynecology

## 2021-08-08 ENCOUNTER — Other Ambulatory Visit: Payer: Self-pay

## 2021-08-08 ENCOUNTER — Encounter (HOSPITAL_COMMUNITY): Payer: Self-pay | Admitting: Obstetrics and Gynecology

## 2021-08-08 DIAGNOSIS — Z20822 Contact with and (suspected) exposure to covid-19: Secondary | ICD-10-CM | POA: Diagnosis present

## 2021-08-08 DIAGNOSIS — Z3A39 39 weeks gestation of pregnancy: Secondary | ICD-10-CM | POA: Diagnosis not present

## 2021-08-08 DIAGNOSIS — O26893 Other specified pregnancy related conditions, third trimester: Secondary | ICD-10-CM | POA: Diagnosis present

## 2021-08-08 DIAGNOSIS — O99214 Obesity complicating childbirth: Secondary | ICD-10-CM | POA: Diagnosis present

## 2021-08-08 HISTORY — DX: Anxiety disorder, unspecified: F41.9

## 2021-08-08 HISTORY — DX: Depression, unspecified: F32.A

## 2021-08-08 LAB — CBC
HCT: 30.1 % — ABNORMAL LOW (ref 36.0–46.0)
Hemoglobin: 10 g/dL — ABNORMAL LOW (ref 12.0–15.0)
MCH: 29.1 pg (ref 26.0–34.0)
MCHC: 33.2 g/dL (ref 30.0–36.0)
MCV: 87.5 fL (ref 80.0–100.0)
Platelets: 271 10*3/uL (ref 150–400)
RBC: 3.44 MIL/uL — ABNORMAL LOW (ref 3.87–5.11)
RDW: 14.4 % (ref 11.5–15.5)
WBC: 11.9 10*3/uL — ABNORMAL HIGH (ref 4.0–10.5)
nRBC: 0 % (ref 0.0–0.2)

## 2021-08-08 LAB — TYPE AND SCREEN
ABO/RH(D): A POS
Antibody Screen: NEGATIVE

## 2021-08-08 LAB — RESP PANEL BY RT-PCR (FLU A&B, COVID) ARPGX2
Influenza A by PCR: NEGATIVE
Influenza B by PCR: NEGATIVE
SARS Coronavirus 2 by RT PCR: NEGATIVE

## 2021-08-08 LAB — RPR: RPR Ser Ql: NONREACTIVE

## 2021-08-08 MED ORDER — BUTORPHANOL TARTRATE 1 MG/ML IJ SOLN: 1.0000 mg | INTRAMUSCULAR | Status: DC | PRN

## 2021-08-08 MED ORDER — EPHEDRINE 5 MG/ML INJ: 10.0000 mg | INTRAVENOUS | Status: DC | PRN

## 2021-08-08 MED ORDER — MISOPROSTOL 25 MCG QUARTER TABLET
25.0000 ug | ORAL_TABLET | ORAL | Status: DC | PRN
  Administered 2021-08-08 (×2): 25 ug via VAGINAL
  Filled 2021-08-08 (×2): qty 1

## 2021-08-08 MED ORDER — LIDOCAINE HCL (PF) 1 % IJ SOLN
INTRAMUSCULAR | Status: DC | PRN
  Administered 2021-08-08: 8 mL via EPIDURAL

## 2021-08-08 MED ORDER — OXYTOCIN BOLUS FROM INFUSION
333.0000 mL | Freq: Once | INTRAVENOUS | Status: AC
  Administered 2021-08-09: 333 mL via INTRAVENOUS

## 2021-08-08 MED ORDER — FENTANYL-BUPIVACAINE-NACL 0.5-0.125-0.9 MG/250ML-% EP SOLN
12.0000 mL/h | EPIDURAL | Status: DC | PRN
  Administered 2021-08-08: 12 mL/h via EPIDURAL
  Filled 2021-08-08: qty 250

## 2021-08-08 MED ORDER — OXYCODONE-ACETAMINOPHEN 5-325 MG PO TABS: 1.0000 | ORAL_TABLET | ORAL | Status: DC | PRN

## 2021-08-08 MED ORDER — DIPHENHYDRAMINE HCL 50 MG/ML IJ SOLN: 12.5000 mg | INTRAMUSCULAR | Status: DC | PRN

## 2021-08-08 MED ORDER — SOD CITRATE-CITRIC ACID 500-334 MG/5ML PO SOLN: 30.0000 mL | ORAL | Status: DC | PRN

## 2021-08-08 MED ORDER — ACETAMINOPHEN 325 MG PO TABS: 650.0000 mg | ORAL_TABLET | ORAL | Status: DC | PRN

## 2021-08-08 MED ORDER — LIDOCAINE HCL (PF) 1 % IJ SOLN: 30.0000 mL | INTRAMUSCULAR | Status: DC | PRN

## 2021-08-08 MED ORDER — LACTATED RINGERS IV SOLN
500.0000 mL | Freq: Once | INTRAVENOUS | Status: AC
  Administered 2021-08-08: 500 mL via INTRAVENOUS

## 2021-08-08 MED ORDER — LACTATED RINGERS IV SOLN
500.0000 mL | INTRAVENOUS | Status: DC | PRN
  Administered 2021-08-09: 500 mL via INTRAVENOUS

## 2021-08-08 MED ORDER — OXYTOCIN-SODIUM CHLORIDE 30-0.9 UT/500ML-% IV SOLN
2.5000 [IU]/h | INTRAVENOUS | Status: DC
  Filled 2021-08-08: qty 500

## 2021-08-08 MED ORDER — PHENYLEPHRINE 40 MCG/ML (10ML) SYRINGE FOR IV PUSH (FOR BLOOD PRESSURE SUPPORT): 80.0000 ug | PREFILLED_SYRINGE | INTRAVENOUS | Status: DC | PRN

## 2021-08-08 MED ORDER — OXYTOCIN-SODIUM CHLORIDE 30-0.9 UT/500ML-% IV SOLN
1.0000 m[IU]/min | INTRAVENOUS | Status: DC
  Administered 2021-08-08: 2 m[IU]/min via INTRAVENOUS

## 2021-08-08 MED ORDER — TERBUTALINE SULFATE 1 MG/ML IJ SOLN: 0.2500 mg | Freq: Once | INTRAMUSCULAR | Status: DC | PRN

## 2021-08-08 MED ORDER — LACTATED RINGERS IV SOLN: INTRAVENOUS | Status: DC

## 2021-08-08 MED ORDER — OXYCODONE-ACETAMINOPHEN 5-325 MG PO TABS: 2.0000 | ORAL_TABLET | ORAL | Status: DC | PRN

## 2021-08-08 MED ORDER — ONDANSETRON HCL 4 MG/2ML IJ SOLN: 4.0000 mg | Freq: Four times a day (QID) | INTRAMUSCULAR | Status: DC | PRN

## 2021-08-08 NOTE — Anesthesia Preprocedure Evaluation (Signed)
Anesthesia Evaluation  Patient identified by MRN, date of birth, ID band Patient awake    Reviewed: Allergy & Precautions, Patient's Chart, lab work & pertinent test results  Airway Mallampati: III  TM Distance: >3 FB Neck ROM: Full    Dental no notable dental hx.    Pulmonary neg pulmonary ROS,    Pulmonary exam normal breath sounds clear to auscultation       Cardiovascular negative cardio ROS Normal cardiovascular exam Rhythm:Regular Rate:Normal     Neuro/Psych PSYCHIATRIC DISORDERS Anxiety Depression negative neurological ROS     GI/Hepatic negative GI ROS, Neg liver ROS,   Endo/Other  goiter  Renal/GU negative Renal ROS  negative genitourinary   Musculoskeletal negative musculoskeletal ROS (+)   Abdominal   Peds negative pediatric ROS (+)  Hematology  (+) anemia ,   Anesthesia Other Findings   Reproductive/Obstetrics (+) Pregnancy                             Anesthesia Physical Anesthesia Plan  ASA: 2  Anesthesia Plan: Epidural   Post-op Pain Management:    Induction:   PONV Risk Score and Plan: 2 and Treatment may vary due to age or medical condition  Airway Management Planned: Natural Airway  Additional Equipment: None  Intra-op Plan:   Post-operative Plan:   Informed Consent: I have reviewed the patients History and Physical, chart, labs and discussed the procedure including the risks, benefits and alternatives for the proposed anesthesia with the patient or authorized representative who has indicated his/her understanding and acceptance.       Plan Discussed with: Anesthesiologist  Anesthesia Plan Comments:         Anesthesia Quick Evaluation

## 2021-08-08 NOTE — H&P (Signed)
Evelyn Landry is a 26 y.o. female, G4 P1021, EGA [redacted] weeks with EDC 1-9 presenting for elective induction.  PNC complicated by obesity, palpitations with cardiology eval and normal ECHO.  OB History     Gravida  4   Para  1   Term  1   Preterm      AB  2   Living  1      SAB  1   IAB  1   Ectopic      Multiple      Live Births  1          Past Medical History:  Diagnosis Date   Anxiety    Depression    Past Surgical History:  Procedure Laterality Date   TONSILLECTOMY     Family History: family history includes Diabetes in her maternal grandmother. Social History:  reports that she has never smoked. She has never used smokeless tobacco. She reports that she does not currently use alcohol. She reports that she does not use drugs.     Maternal Diabetes: No Genetic Screening: Normal Maternal Ultrasounds/Referrals: Normal Fetal Ultrasounds or other Referrals:  None Maternal Substance Abuse:  No Significant Maternal Medications:  None Significant Maternal Lab Results:  Group B Strep negative Other Comments:  None  Review of Systems  Respiratory: Negative.    Cardiovascular: Negative.   Maternal Medical History:  Contractions: Perceived severity is mild.   Fetal activity: Perceived fetal activity is normal.   Prenatal Complications - Diabetes: none.  Dilation: Fingertip Effacement (%): 60 Station: Ballotable Exam by:: A Showfety RN Blood pressure 134/89, pulse (!) 108, temperature 98.4 F (36.9 C), temperature source Oral, resp. rate 18, height 5\' 2"  (1.575 m), weight 92.4 kg, last menstrual period 11/07/2020. Maternal Exam:  Uterine Assessment: Contraction strength is mild.  Contraction frequency is irregular.  Abdomen: Patient reports no abdominal tenderness. Estimated fetal weight is 7.5 lbs.   Fetal presentation: vertex Introitus: Normal vulva. Normal vagina.  Amniotic fluid character: not assessed. Pelvis: adequate for delivery.     Fetal  Exam Fetal Monitor Review: Mode: ultrasound.   Baseline rate: 120s.  Variability: moderate (6-25 bpm).   Pattern: accelerations present and no decelerations.   Fetal State Assessment: Category I - tracings are normal.  Physical Exam Vitals reviewed.  Constitutional:      Appearance: Normal appearance.  Cardiovascular:     Rate and Rhythm: Normal rate and regular rhythm.  Pulmonary:     Effort: Pulmonary effort is normal. No respiratory distress.  Abdominal:     Palpations: Abdomen is soft.  Genitourinary:    General: Normal vulva.  Neurological:     Mental Status: She is alert.    Prenatal labs: ABO, Rh: --/--/A POS (01/03 06-02-1981) Antibody: NEG (01/03 0746) Rubella: Immune (06/08 0000) RPR: Nonreactive (06/08 0000)  HBsAg: Negative (06/08 0000)  HIV: Non-reactive (06/08 0000)  GBS:   neg  Assessment/Plan: IUP at 39 weeks for elective induction, unfavorable cervix.  First cytotec placed, will plan on a second dose after 4 hours, then assess   03-11-1991 Evelyn Landry 08/08/2021, 9:07 AM

## 2021-08-08 NOTE — Anesthesia Procedure Notes (Signed)
Epidural Patient location during procedure: OB Start time: 08/08/2021 9:15 PM End time: 08/08/2021 9:25 PM  Staffing Anesthesiologist: Mellody Dance, MD Performed: anesthesiologist   Preanesthetic Checklist Completed: patient identified, IV checked, site marked, risks and benefits discussed, monitors and equipment checked, pre-op evaluation and timeout performed  Epidural Patient position: sitting Prep: DuraPrep Patient monitoring: heart rate, cardiac monitor, continuous pulse ox and blood pressure Approach: midline Location: L2-L3 Injection technique: LOR saline  Needle:  Needle type: Tuohy  Needle gauge: 17 G Needle length: 9 cm Needle insertion depth: 5 cm Catheter type: closed end flexible Catheter size: 20 Guage Catheter at skin depth: 10 cm Test dose: negative and Other  Assessment Events: blood not aspirated, injection not painful, no injection resistance and negative IV test  Additional Notes Informed consent obtained prior to proceeding including risk of failure, 1% risk of PDPH, risk of minor discomfort and bruising.  Discussed rare but serious complications including epidural abscess, permanent nerve injury, epidural hematoma.  Discussed alternatives to epidural analgesia and patient desires to proceed.  Timeout performed pre-procedure verifying patient name, procedure, and platelet count.  Patient tolerated procedure well.

## 2021-08-08 NOTE — Progress Notes (Signed)
Feeling some ctx since 2nd cytotec Afeb, VSS VE-2/50/-3, vtx  Will start pitocin, monitor progress

## 2021-08-09 ENCOUNTER — Encounter (HOSPITAL_COMMUNITY): Payer: Self-pay | Admitting: Obstetrics and Gynecology

## 2021-08-09 MED ORDER — ACETAMINOPHEN 325 MG PO TABS: 650.0000 mg | ORAL_TABLET | ORAL | Status: DC | PRN

## 2021-08-09 MED ORDER — BENZOCAINE-MENTHOL 20-0.5 % EX AERO: 1.0000 "application " | INHALATION_SPRAY | CUTANEOUS | Status: DC | PRN

## 2021-08-09 MED ORDER — ZOLPIDEM TARTRATE 5 MG PO TABS: 5.0000 mg | ORAL_TABLET | Freq: Every evening | ORAL | Status: DC | PRN

## 2021-08-09 MED ORDER — BUPIVACAINE HCL (PF) 0.25 % IJ SOLN
INTRAMUSCULAR | Status: DC | PRN
  Administered 2021-08-09: 10 mL via EPIDURAL

## 2021-08-09 MED ORDER — DIPHENHYDRAMINE HCL 25 MG PO CAPS: 25.0000 mg | ORAL_CAPSULE | Freq: Four times a day (QID) | ORAL | Status: DC | PRN

## 2021-08-09 MED ORDER — PRENATAL MULTIVITAMIN CH
1.0000 | ORAL_TABLET | Freq: Every day | ORAL | Status: DC
  Administered 2021-08-09 – 2021-08-10 (×2): 1 via ORAL
  Filled 2021-08-09 (×2): qty 1

## 2021-08-09 MED ORDER — MEASLES, MUMPS & RUBELLA VAC IJ SOLR: 0.5000 mL | Freq: Once | INTRAMUSCULAR | Status: DC

## 2021-08-09 MED ORDER — ONDANSETRON HCL 4 MG/2ML IJ SOLN
4.0000 mg | INTRAMUSCULAR | Status: DC | PRN
  Administered 2021-08-09: 4 mg via INTRAVENOUS
  Filled 2021-08-09: qty 2

## 2021-08-09 MED ORDER — SENNOSIDES-DOCUSATE SODIUM 8.6-50 MG PO TABS
2.0000 | ORAL_TABLET | Freq: Every day | ORAL | Status: DC
  Administered 2021-08-10: 2 via ORAL
  Filled 2021-08-09: qty 2

## 2021-08-09 MED ORDER — METHYLERGONOVINE MALEATE 0.2 MG PO TABS: 0.2000 mg | ORAL_TABLET | ORAL | Status: DC | PRN

## 2021-08-09 MED ORDER — COCONUT OIL OIL: 1.0000 "application " | TOPICAL_OIL | Status: DC | PRN

## 2021-08-09 MED ORDER — OXYCODONE HCL 5 MG PO TABS: 10.0000 mg | ORAL_TABLET | ORAL | Status: DC | PRN

## 2021-08-09 MED ORDER — METHYLERGONOVINE MALEATE 0.2 MG/ML IJ SOLN: 0.2000 mg | INTRAMUSCULAR | Status: DC | PRN

## 2021-08-09 MED ORDER — ONDANSETRON HCL 4 MG PO TABS: 4.0000 mg | ORAL_TABLET | ORAL | Status: DC | PRN

## 2021-08-09 MED ORDER — IBUPROFEN 600 MG PO TABS
600.0000 mg | ORAL_TABLET | Freq: Four times a day (QID) | ORAL | Status: DC
  Administered 2021-08-10 (×2): 600 mg via ORAL
  Filled 2021-08-09 (×4): qty 1

## 2021-08-09 MED ORDER — TETANUS-DIPHTH-ACELL PERTUSSIS 5-2.5-18.5 LF-MCG/0.5 IM SUSY: 0.5000 mL | PREFILLED_SYRINGE | Freq: Once | INTRAMUSCULAR | Status: DC

## 2021-08-09 MED ORDER — MAGNESIUM HYDROXIDE 400 MG/5ML PO SUSP: 30.0000 mL | ORAL | Status: DC | PRN

## 2021-08-09 MED ORDER — DIBUCAINE (PERIANAL) 1 % EX OINT: 1.0000 "application " | TOPICAL_OINTMENT | CUTANEOUS | Status: DC | PRN

## 2021-08-09 MED ORDER — SIMETHICONE 80 MG PO CHEW: 80.0000 mg | CHEWABLE_TABLET | ORAL | Status: DC | PRN

## 2021-08-09 MED ORDER — WITCH HAZEL-GLYCERIN EX PADS: 1.0000 "application " | MEDICATED_PAD | CUTANEOUS | Status: DC | PRN

## 2021-08-09 MED ORDER — OXYCODONE HCL 5 MG PO TABS: 5.0000 mg | ORAL_TABLET | ORAL | Status: DC | PRN

## 2021-08-09 NOTE — Anesthesia Postprocedure Evaluation (Signed)
Anesthesia Post Note  Patient: Evelyn Landry  Procedure(s) Performed: AN AD HOC LABOR EPIDURAL     Patient location during evaluation: Mother Baby Anesthesia Type: Epidural Level of consciousness: awake and alert Pain management: pain level controlled Vital Signs Assessment: post-procedure vital signs reviewed and stable Respiratory status: spontaneous breathing, nonlabored ventilation and respiratory function stable Cardiovascular status: stable Postop Assessment: no headache, no backache and epidural receding Anesthetic complications: no   No notable events documented.  Last Vitals:  Vitals:   08/09/21 0900 08/09/21 1236  BP: 123/76 (!) 104/54  Pulse: 90 78  Resp: 18 17  Temp: 36.9 C 37.2 C  SpO2:      Last Pain:  Vitals:   08/09/21 1236  TempSrc: Oral  PainSc: 0-No pain   Pain Goal: Patients Stated Pain Goal: 3 (08/09/21 1042)                 [name] N

## 2021-08-09 NOTE — Lactation Note (Addendum)
This note was copied from a baby's chart. Lactation Consultation Note  Patient Name: Evelyn Landry [id number] Date: 08/09/2021 Reason for consult: Follow-up assessment;Term Age:28 hours P2, 14 hour term female infant. Per mom, she would like to breastfeed infant, her son never latch well, she BF/formula for 3 months. LC entered the room, mom was attempting breastfeed infant , infant was swaddled in blankets, mom is open to breastfeeding infant skin to skin. Mom latched infant on her left breast, infant was on and off at first but after 8 minutes infant sustain latch with depth and breastfeed for total of 18 minutes. LC reviewed how to hand express using breast model, mom self expressed and infant was given 1 ml by spoon. Mom understands infant may cluster feed at 24 hours of life and this is normal pattern of infant behavior. Mom's plan: 1-Mom will continue to breastfeed infant according to hunger cues, 8 to 12+ times within 24 hours, skin to skin. 2-Mom will attempt to latch infant on both breast during a feeding. 3- Mom will call RN/LC if she has any further breastfeeding questions, concerns or need assistance with latching infant at the breast. 4- Mom knows to hand express if infant doesn't latch and do lots of skin to skin with infant. Maternal Data    Feeding Mother's Current Feeding Choice: Breast Milk  LATCH Score Latch: Repeated attempts needed to sustain latch, nipple held in mouth throughout feeding, stimulation needed to elicit sucking reflex. (After 8 minutes infant started sustaining latch during the feeding.)  Audible Swallowing: Spontaneous and intermittent  Type of Nipple: Everted at rest and after stimulation  Comfort (Breast/Nipple): Soft / non-tender  Hold (Positioning): Assistance needed to correctly position infant at breast and maintain latch.  LATCH Score: 8   Lactation Tools Discussed/Used    Interventions Interventions: Skin to skin;Hand  express;Breast compression;Adjust position;Support pillows;Position options;Expressed milk;Education;LC Services brochure  Discharge    Consult Status Consult Status: Follow-up Date: 08/10/21 Follow-up type: In-patient    Danelle Earthly 08/09/2021, 8:15 PM
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
## 2021-08-09 NOTE — Lactation Note (Signed)
This note was copied from a baby's chart. Lactation Consultation Note  Patient Name: Girl Jaunice Mirza [id number] Date: 08/09/2021 Reason for consult: Initial assessment;Term;Other (Comment);Breastfeeding assistance (baby still hungry and LC assisted to switch and latch on the left side and after several attempts and steady flow of colostrum baby fed 7- 8 mins with several swallows / nipple well rounded when baby released.) Age:28 hours  Maternal Data Has patient been taught Hand Expression?: Yes Does the patient have breastfeeding experience prior to this delivery?: Yes How long did the patient breastfeed?: per mom attempted for not very long and started supplementing with formula prior to leaving the hospital / milk never really came in well. + breast changes with this pregnancy  Feeding Mother's Current Feeding Choice: Breast Milk  LATCH Score Latch: Repeated attempts needed to sustain latch, nipple held in mouth throughout feeding, stimulation needed to elicit sucking reflex.  Audible Swallowing: Spontaneous and intermittent  Type of Nipple: Everted at rest and after stimulation  Comfort (Breast/Nipple): Soft / non-tender  Hold (Positioning): Assistance needed to correctly position infant at breast and maintain latch.  LATCH Score: 8   Lactation Tools Discussed/Used    Interventions Interventions: Breast feeding basics reviewed;Assisted with latch;Skin to skin;Reverse pressure;Breast compression;Adjust position;Support pillows;Education  Discharge Pump: Personal;DEBP  Consult Status Consult Status: Follow-up Date: 08/09/21 Follow-up type: In-patient    Matilde Sprang Krysti Hickling 08/09/2021, 11:24 AM

## 2021-08-09 NOTE — Lactation Note (Addendum)
This note was copied from a baby's chart. Lactation Consultation Note  Patient Name: Evelyn Landry [id number] Date: 08/09/2021 Reason for consult: L&D Initial assessment;Other (Comment) (attempted to see dyad in LD / mom in the W/C , baby in her arms and being transferred to Room 408. mom aware LC will see her on MBU after she is settled.) Age:28 hours LC noted from the doc flow sheets baby has BF x 1 30 mins with Latch score - 7   Maternal Data    Feeding Mother's Current Feeding Choice: Breast Milk  LATCH Score - Latch Score by the LD RN  Latch: Repeated attempts needed to sustain latch, nipple held in mouth throughout feeding, stimulation needed to elicit sucking reflex.  Audible Swallowing: Spontaneous and intermittent  Type of Nipple: Everted at rest and after stimulation  Comfort (Breast/Nipple): Soft / non-tender  Hold (Positioning): Full assist, staff holds infant at breast  LATCH Score: 7   Lactation Tools Discussed/Used    Interventions    Discharge    Consult Status Consult Status: Follow-up from L&D Date: 08/09/21 Follow-up type: In-patient    Ruth 08/09/2021, 7:50 AM

## 2021-08-09 NOTE — Progress Notes (Signed)
Comfortable with epidural Afeb, VSS FHT-110-120, occ variable decel, Cat II, ctx q 2-3 min on 14 mu/min pitocin VE-3/50/-2, vtx, AROM clear  Still in latent labor, AROM done, continue pitocin, monitor progress, anticipate SVD

## 2021-08-10 MED ORDER — ACETAMINOPHEN 325 MG PO TABS: 650.0000 mg | ORAL_TABLET | ORAL | 0 refills | Status: DC | PRN

## 2021-08-10 MED ORDER — IBUPROFEN 600 MG PO TABS: 600.0000 mg | ORAL_TABLET | Freq: Four times a day (QID) | ORAL | 0 refills | Status: AC

## 2021-08-10 NOTE — Lactation Note (Signed)
This note was copied from a baby's chart. Lactation Consultation Note  Patient Name: Evelyn Landry [id number] Date: 08/10/2021 Reason for consult: Follow-up assessment;Mother's request;Term;Breastfeeding assistance;Infant weight loss Age:26 years  LC provided Mother with hand pump and sent of a [facility] referral for electric pump at home.  Mom states feeding going well and denies pain with the latch. Mom encouraged to give more volume after latching with either hand expression of manual pump.  Mom feeding plan EBF for now and offer any colostrum via spoon with increase in colostrum progress to pace bottle feeding with slow flow nipple.  BF supplementation guide provided along with slow flow nipples.   Infant had 2 urine and 2 stool today according to mother.   All questions answered at the end of the visit.   Maternal Data Has patient been taught Hand Expression?: Yes  Feeding Mother's Current Feeding Choice: Breast Milk  LATCH Score                    Lactation Tools Discussed/Used Tools: Pump;Flanges Flange Size: 21 Breast pump type: Manual Pump Education: Setup, frequency, and cleaning;Milk Storage Reason for Pumping: increase stimulation Pumping frequency: every 3 hrs for 10 min  Interventions Interventions: Breast feeding basics reviewed;Hand express;Expressed milk;Hand pump;Education;LC Services brochure;Infant Driven Feeding Algorithm education  Discharge Discharge Education: Engorgement and breast care;Warning signs for feeding baby Pump: Manual WIC Program: Yes  Consult Status Consult Status: Complete Date: 08/10/21    Dotsie Gillette  Nicholson-Springer 08/10/2021, 2:53 PM

## 2021-08-10 NOTE — Discharge Summary (Signed)
Postpartum Discharge Summary       Patient Name: Evelyn Landry DOB: Dec 28, 1995 MRN: [id number]  Date of admission: 08/08/2021 Delivery date:08/09/2021  Delivering provider: Jackelyn Knife, TODD  Date of discharge: 08/10/2021  Admitting diagnosis: Indication for care in labor or delivery [O75.9] Intrauterine pregnancy: [redacted]w[redacted]d     Secondary diagnosis:  Principal Problem:   Indication for care in labor or delivery Active Problems:   SVD (spontaneous vaginal delivery)  Additional problems: none    Discharge diagnosis: Term Pregnancy Delivered                                              Post partum procedures: none Augmentation: AROM, Pitocin, and Cytotec Complications: None  Hospital course: Induction of Labor With Vaginal Delivery   26 y.o. yo [id number] at [redacted]w[redacted]d was admitted to the hospital 08/08/2021 for induction of labor.  Indication for induction: Elective.  Patient had an uncomplicated labor course as follows: Membrane Rupture Time/Date: 12:11 AM ,08/09/2021   Delivery Method:Vaginal, Spontaneous  Episiotomy: None  Lacerations:  Labial  Details of delivery can be found in separate delivery note.  Patient had a routine postpartum course. Patient is discharged home 08/10/21.  Newborn Data: Birth date:08/09/2021  Birth time:5:28 AM  Gender:Female  Living status:Living  Apgars:9 ,9  Weight:3090 g   Magnesium Sulfate received: No BMZ received: No Rhophylac:No  Physical exam  Vitals:   08/09/21 1710 08/09/21 2105 08/10/21 0551 08/10/21 0637  BP: 122/79 129/73 132/90 116/86  Pulse: 76 74 72 71  Resp: 18 18 18    Temp: 98.9 F (37.2 C) 97.7 F (36.5 C) 98.2 F (36.8 C)   TempSrc: Oral Oral Oral   SpO2:  100% 100%   Weight:      Height:       General: alert and cooperative Lochia: appropriate Uterine Fundus: firm  Labs: Lab Results  Component Value Date   WBC 11.9 (H) 08/08/2021   HGB 10.0 (L) 08/08/2021   HCT 30.1 (L) 08/08/2021   MCV 87.5 08/08/2021   PLT 271  08/08/2021   CMP Latest Ref Rng & Units 06/20/2021  Glucose 70 - 99 mg/dL 73  BUN 6 - 20 mg/dL 5(L)  Creatinine 06/22/2021 - 1.00 mg/dL 4.69)  Sodium 6.29(B - 284 mmol/L 142  Potassium 3.5 - 5.2 mmol/L 3.2(L)  Chloride 96 - 106 mmol/L 105  CO2 20 - 29 mmol/L 21  Calcium 8.7 - 10.2 mg/dL 9.4  Total Protein 6.5 - 8.1 g/dL -  Total Bilirubin 0.3 - 1.2 mg/dL -  Alkaline Phos 38 - 132 U/L -  AST 15 - 41 U/L -  ALT 0 - 44 U/L -   Edinburgh Score: Edinburgh Postnatal Depression Scale Screening Tool 08/09/2021  I have been able to laugh and see the funny side of things. 1  I have looked forward with enjoyment to things. 2  I have blamed myself unnecessarily when things went wrong. 2  I have been anxious or worried for no good reason. 3  I have felt scared or panicky for no good reason. 3  Things have been getting on top of me. 2  I have been so unhappy that I have had difficulty sleeping. 2  I have felt sad or miserable. 2  I have been so unhappy that I have been crying. 3  The thought of harming myself  has occurred to me. 0  Edinburgh Postnatal Depression Scale Total 20     After visit meds:  Allergies as of 08/10/2021   No Known Allergies      Medication List     STOP taking these medications    ondansetron 4 MG disintegrating tablet Commonly known as: Zofran ODT   potassium chloride SA 20 MEQ tablet Commonly known as: KLOR-CON M   promethazine 25 MG tablet Commonly known as: PHENERGAN       TAKE these medications    acetaminophen 325 MG tablet Commonly known as: Tylenol Take 2 tablets (650 mg total) by mouth every 4 (four) hours as needed (for pain scale < 4).   famotidine 20 MG tablet Commonly known as: PEPCID Take 1 tablet (20 mg total) by mouth at bedtime.   ibuprofen 600 MG tablet Commonly known as: ADVIL Take 1 tablet (600 mg total) by mouth every 6 (six) hours.         Discharge home in stable condition Infant Feeding: Breast Infant Disposition:home  with mother Discharge instruction: per After Visit Summary and Postpartum booklet. Activity: Advance as tolerated. Pelvic rest for 6 weeks.  Diet: routine diet Future Appointments: Future Appointments  Date Time Provider Department Center  09/12/2021 10:00 AM Tobb, Lavona [place], DO [facility]   Follow up Visit:  Follow-up Information     Meisinger, Todd, MD. Schedule an appointment as soon as possible for a visit in 6 week(s).   Specialty: Obstetrics and Gynecology Why: postpartum visit Contact information: [address] [place] [address] [phone]                  Please schedule this patient for a In person postpartum visit in 6 weeks with the following provider: MD.  Delivery mode:  Vaginal, Spontaneous  Anticipated Birth Control:  Unsure   08/10/2021 Oliver Pila, MD

## 2021-08-10 NOTE — Progress Notes (Signed)
Post Partum Day 1 Subjective: up ad lib and tolerating PO  Some cramping not relieved by ibuprofen but thinks can manage. Lochia normal.  Hoping for d/c today  Objective: Blood pressure 116/86, pulse 71, temperature 98.2 F (36.8 C), temperature source Oral, resp. rate 18, height 5\' 2"  (1.575 m), weight 92.4 kg, last menstrual period 11/07/2020, SpO2 100 %, unknown if currently breastfeeding.  Physical Exam:  General: alert and cooperative Lochia: appropriate Uterine Fundus: firm   Recent Labs    08/08/21 0746  HGB 10.0*  HCT 30.1*    Assessment/Plan: Discharge home if baby able to go.   LOS: 2 days   Logan Bores 08/10/2021, 10:12 AM

## 2021-08-22 ENCOUNTER — Telehealth (HOSPITAL_COMMUNITY): Payer: Self-pay | Admitting: *Deleted

## 2021-08-22 NOTE — Telephone Encounter (Signed)
Attempted Hospital Discharge Follow-Up Call.  Left voice mail requesting that patient return RN's phone call.  

## 2021-09-12 ENCOUNTER — Ambulatory Visit: Payer: Medicaid Other | Admitting: Cardiology

## 2021-09-23 ENCOUNTER — Encounter (HOSPITAL_COMMUNITY): Payer: Self-pay | Admitting: Emergency Medicine

## 2021-09-23 ENCOUNTER — Emergency Department (HOSPITAL_COMMUNITY)
Admission: EM | Admit: 2021-09-23 | Discharge: 2021-09-23 | Disposition: A | Discharge status: Home / Self Care | Payer: Medicaid Other | Attending: Emergency Medicine | Admitting: Emergency Medicine

## 2021-09-23 DIAGNOSIS — Z5321 Procedure and treatment not carried out due to patient leaving prior to being seen by health care provider: Secondary | ICD-10-CM | POA: Insufficient documentation

## 2021-09-23 DIAGNOSIS — M546 Pain in thoracic spine: Secondary | ICD-10-CM | POA: Insufficient documentation

## 2021-09-23 DIAGNOSIS — R109 Unspecified abdominal pain: Secondary | ICD-10-CM | POA: Diagnosis present

## 2021-09-23 DIAGNOSIS — W228XXA Striking against or struck by other objects, initial encounter: Secondary | ICD-10-CM | POA: Insufficient documentation

## 2021-09-23 NOTE — ED Notes (Signed)
Patient states she bent her back "a certain way" and now she feels better. Patient requesting to leave. This nurse went over AMA form and had patient sign.
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
## 2021-10-11 ENCOUNTER — Encounter: Payer: Self-pay | Admitting: Cardiology

## 2021-10-11 ENCOUNTER — Ambulatory Visit: Payer: Medicaid Other | Admitting: Cardiology

## 2021-10-11 ENCOUNTER — Other Ambulatory Visit: Payer: Self-pay

## 2021-10-11 VITALS — BP 120/80 | HR 100 | Ht 61.0 in | Wt 187.4 lb

## 2021-10-11 DIAGNOSIS — E669 Obesity, unspecified: Secondary | ICD-10-CM | POA: Diagnosis not present

## 2021-10-11 DIAGNOSIS — R002 Palpitations: Secondary | ICD-10-CM | POA: Diagnosis not present

## 2021-10-11 NOTE — Progress Notes (Signed)
?[facility] ? ?Follow Up Note ? ? ?Date:  10/12/2021  ? ?ID:  Evelyn Landry, DOB 1996/07/29, MRN [id number] ? ?PCP:  Pcp, No ?  ?[facility] Providers ?Cardiologist:  Berniece Salines, DO  ?Electrophysiologist:  None      ? ? ?Referring MD: No ref. provider found  ? ?Chief Complaint: " I am doing well" ? ?History of Present Illness:   ? ?Evelyn Landry is a 26 y.o. female [[id number]] who returns for follow up of postpartum.  I did see the patient during pregnancy when she was experiencing significant, palpitation and left arm pain.  At that time she was [redacted] weeks pregnant.  I placed a monitor on the patient as well as got an echocardiogram but both testings were normal. ? ?She was able to successfully deliver her baby.  She is here today for postpartum visit.  She tells me she is doing well.  She denies any further symptoms. ? ? ?Prior CV Studies Reviewed: ?The following studies were reviewed today: ? ?Zio monitor:  ?Patch Wear Time:  14 days and 0 hours starting June 28, 2021 ?Indication: Palpitations ?Patient had a min HR of 63 bpm, max HR of 166 bpm, and avg HR of 94 bpm. Predominant underlying rhythm was Sinus Rhythm. Isolated SVEs were rare (<1.0%), SVE Couplets were rare (<1.0%), and no SVE Triplets were present. Isolated VEs were rare (<1.0%),  ?and no VE Couplets or VE Triplets were present.  ?Symptoms associated with sinus rhythm and sinus tachycardia. ?Conclusion: Normal/unremarkable study. ? ?TTE 06/27/2021:  ?Patch Wear Time:  14 days and 0 hours starting June 28, 2021 ?Indication: Palpitations ?  ?  ?Patient had a min HR of 63 bpm, max HR of 166 bpm, and avg HR of 94 bpm. Predominant underlying rhythm was Sinus Rhythm. Isolated SVEs were rare (<1.0%), SVE Couplets were rare (<1.0%), and no SVE Triplets were present. Isolated VEs were rare (<1.0%),  ?and no VE Couplets or VE Triplets were present.  ?  ?Symptoms associated with sinus rhythm and sinus tachycardia. ?  ?Conclusion:  Normal/unremarkable study. ? ? ?Past Medical History:  ?Diagnosis Date  ? Anxiety   ? Depression   ? ? ?Past Surgical History:  ?Procedure Laterality Date  ? TONSILLECTOMY    ?   ? ?OB History   ? ? Gravida  ?4  ? Para  ?2  ? Term  ?2  ? Preterm  ?   ? AB  ?2  ? Living  ?2  ?  ? ? SAB  ?1  ? IAB  ?1  ? Ectopic  ?   ? Multiple  ?0  ? Live Births  ?2  ?   ?  ?  ?    ? ? ?Current Medications: ?Current Meds  ?Medication Sig  ? hydrOXYzine (ATARAX) 25 MG tablet Take 25 mg by mouth as needed.  ? ibuprofen (ADVIL) 600 MG tablet Take 1 tablet (600 mg total) by mouth every 6 (six) hours. (Patient taking differently: Take 600 mg by mouth as needed.)  ?  ? ?Allergies:   Patient has no known allergies.  ? ?Social History  ? ?Socioeconomic History  ? Marital status: Single  ?  Spouse name: Not on file  ? Number of children: Not on file  ? Years of education: Not on file  ? Highest education level: Not on file  ?Occupational History  ? Not on file  ?Tobacco Use  ? Smoking status: Never  ? Smokeless tobacco: Never  ?  Vaping Use  ? Vaping Use: Never used  ?Substance and Sexual Activity  ? Alcohol use: Not Currently  ? Drug use: Never  ? Sexual activity: Not Currently  ?Other Topics Concern  ? Not on file  ?Social History Narrative  ? Not on file  ? ?Social Determinants of Health  ? ?Financial Resource Strain: Not on file  ?Food Insecurity: No Food Insecurity  ? Worried About Charity fundraiser in the Last Year: Never true  ? Ran Out of Food in the Last Year: Never true  ?Transportation Needs: No Transportation Needs  ? Lack of Transportation (Medical): No  ? Lack of Transportation (Non-Medical): No  ?Physical Activity: Not on file  ?Stress: Not on file  ?Social Connections: Not on file  ?  ? ? ?Family History  ?Problem Relation Age of Onset  ? Diabetes Maternal Grandmother   ?   ? ?ROS:   ?Please see the history of present illness.    ? ?All other systems reviewed and are negative. ? ? ?Labs/EKG Reviewed:   ? ?EKG:   ?EKG is was  not ordered today.   ? ?Recent Labs: ?01/09/2021: ALT 22 ?06/20/2021: BUN 5; Creatinine, Ser 0.51; Magnesium 1.7; Potassium 3.2; Sodium 142 ?08/08/2021: Hemoglobin 10.0; Platelets 271  ? ?Recent Lipid Panel ?No results found for: CHOL, TRIG, HDL, CHOLHDL, LDLCALC, LDLDIRECT ? ?Physical Exam:   ? ?VS:  BP 120/80   Pulse 100   Ht 5\' 1"  (1.549 m)   Wt 187 lb 6.4 oz (85 kg)   LMP 11/07/2020 (Approximate)   SpO2 98%   Breastfeeding Yes   BMI 35.41 kg/m?    ? ?Wt Readings from Last 3 Encounters:  ?10/11/21 187 lb 6.4 oz (85 kg)  ?09/23/21 180 lb (81.6 kg)  ?08/08/21 203 lb 11.2 oz (92.4 kg)  ?  ? ?GEN:  Well nourished, well developed in no acute distress ?HEENT: Normal ?NECK: No JVD; No carotid bruits ?LYMPHATICS: No lymphadenopathy ?CARDIAC: RRR, no murmurs, rubs, gallops ?RESPIRATORY:  Clear to auscultation without rales, wheezing or rhonchi  ?ABDOMEN: Soft, non-tender, non-distended ?MUSCULOSKELETAL:  No edema; No deformity  ?SKIN: Warm and dry ?NEUROLOGIC:  Alert and oriented x 3 ?PSYCHIATRIC:  Normal affect  ? ? ?Risk Assessment/Risk Calculators:   ? ?  ?  ?   ?  ?  ? ? ?ASSESSMENT & PLAN:   ? ?Palpitation ?Dizziness ?Obesity ? ? ?Thankfully she has not had any more episodes of palpitation and dizziness.  There is no need for any further testing at this time. ? ?Obesity-the patient understands the need to lose weight with diet and exercise. We have discussed specific strategies for this. ? ?Patient Instructions  ?Medication Instructions:  ?Your physician recommends that you continue on your current medications as directed. Please refer to the Current Medication list given to you today.  ?*If you need a refill on your cardiac medications before your next appointment, please call your pharmacy* ? ? ?Lab Work: ?None ?If you have labs (blood work) drawn today and your tests are completely normal, you will receive your results only by: ?MyChart Message (if you have MyChart) OR ?A paper copy in the mail ?If you have any  lab test that is abnormal or we need to change your treatment, we will call you to review the results. ? ? ?Testing/Procedures: ?None ? ? ?Follow-Up: ?At [facility], you and your health needs are our priority.  As part of our continuing mission to provide you with exceptional  heart care, we have created designated Provider Care Teams.  These Care Teams include your primary Cardiologist (physician) and Advanced Practice Providers (APPs -  Physician Assistants and Nurse Practitioners) who all work together to provide you with the care you need, when you need it. ? ?We recommend signing up for the patient portal called "MyChart".  Sign up information is provided on this After Visit Summary.  MyChart is used to connect with patients for Virtual Visits (Telemedicine).  Patients are able to view lab/test results, encounter notes, upcoming appointments, etc.  Non-urgent messages can be sent to your provider as well.   ?To learn more about what you can do with MyChart, go to [email or web address].   ? ?Your next appointment:   ?As needed ? ?The format for your next appointment:   ?In Person ? ?Provider:   ?Berniece Salines, DO   ? ? ?Other Instructions ?  ? ? ?Dispo:  No follow-ups on file.  ? ?Medication Adjustments/Labs and Tests Ordered: ?Current medicines are reviewed at length with the patient today.  Concerns regarding medicines are outlined above.  ?Tests Ordered: ?No orders of the defined types were placed in this encounter. ? ?Medication Changes: ?No orders of the defined types were placed in this encounter. ? ?

## 2021-10-11 NOTE — Patient Instructions (Signed)

## 2021-10-12 DIAGNOSIS — E669 Obesity, unspecified: Secondary | ICD-10-CM | POA: Insufficient documentation

## 2021-10-12 DIAGNOSIS — R002 Palpitations: Secondary | ICD-10-CM | POA: Insufficient documentation

## 2023-03-16 IMAGING — CT CT ABD-PELV W/ CM
2 of 4 series · 16 of 46 positions shown, 18 images · IV contrast (omnipaque)
Comparison: None.

CLINICAL DATA: Umbilical and epigastric pain accompanied by
nausea/vomiting

EXAM:
CT ABDOMEN AND PELVIS WITH CONTRAST
TECHNIQUE: Multidetector CT imaging of the abdomen and pelvis was performed
using the standard protocol following bolus administration of
intravenous contrast.
CONTRAST:  100mL OMNIPAQUE IOHEXOL 300 MG/ML  SOLN

[Series 2: axial st · axial · 0.95mm/px · z∈[-498,-73]mm · 13 of 96 slices shown, 15 images]
[im 6/96  soft-tissue]
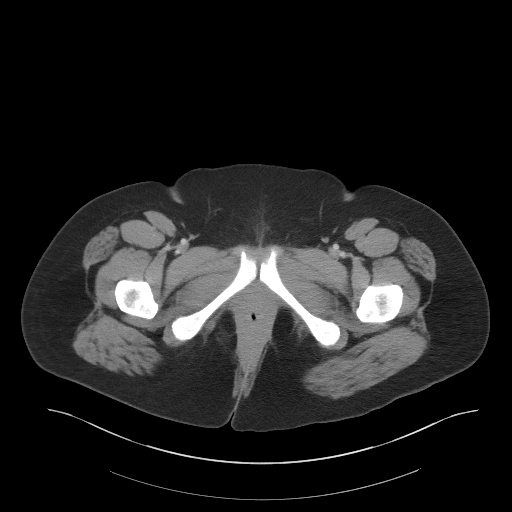
[im 6/96  bone]
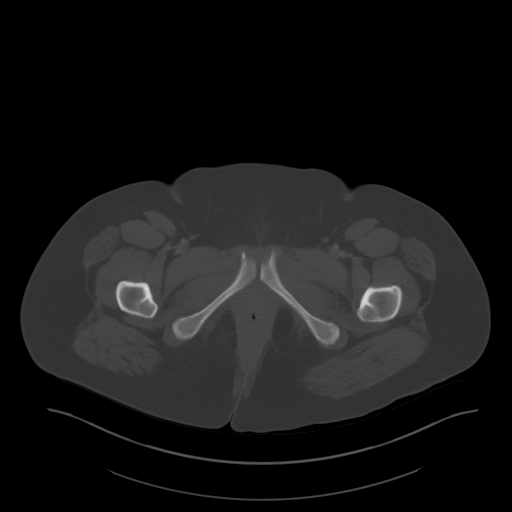
[im 16/96  soft-tissue]
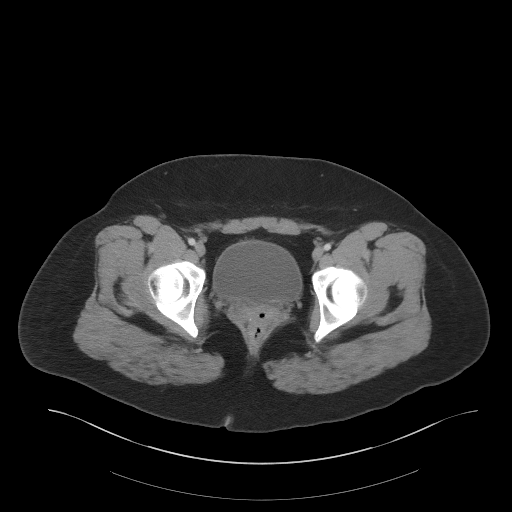
[im 21/96  soft-tissue]
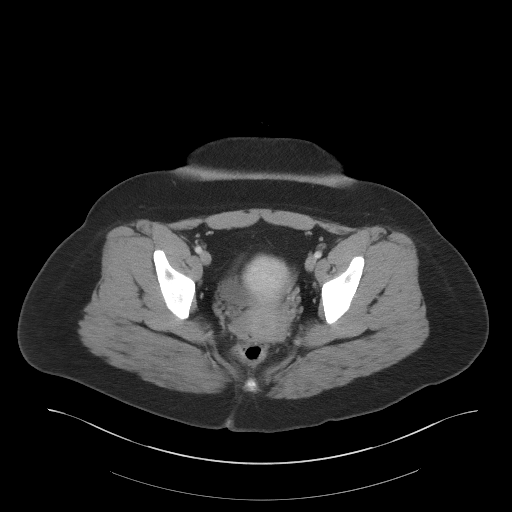
[im 26/96  soft-tissue]
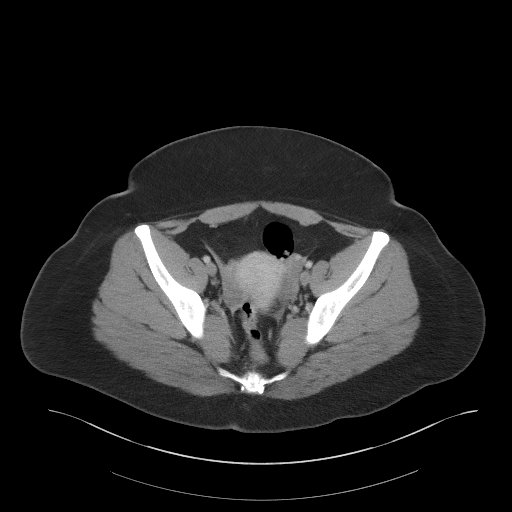
[im 36/96  soft-tissue]
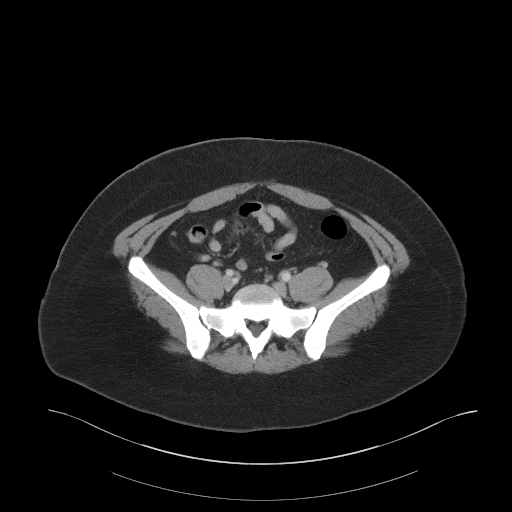
[im 41/96  soft-tissue]
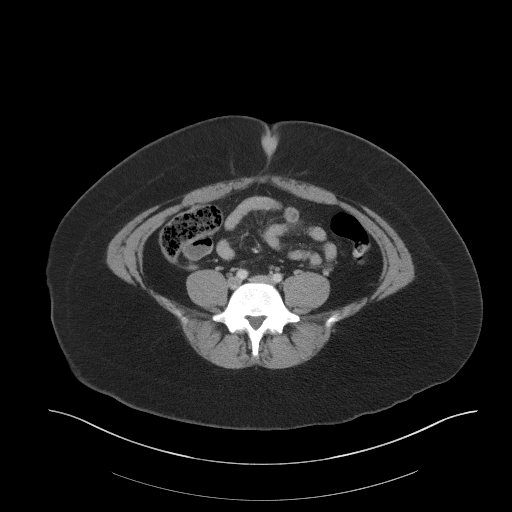
[im 51/96  soft-tissue]
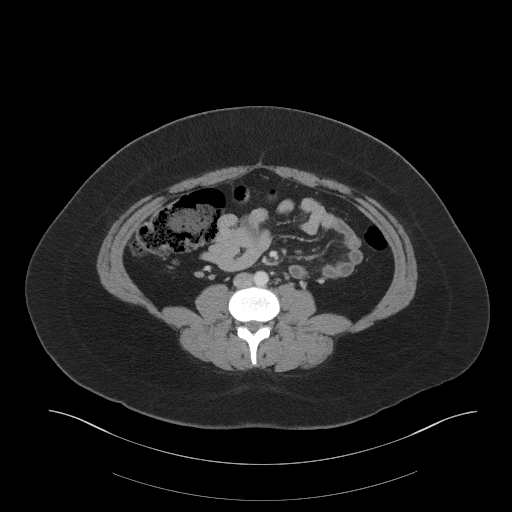
[im 56/96  soft-tissue]
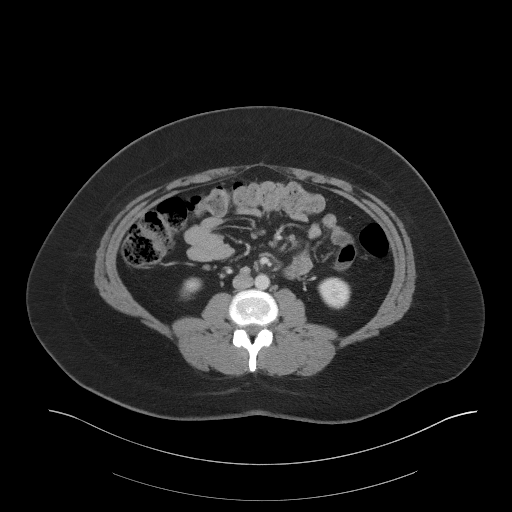
[im 61/96  soft-tissue]
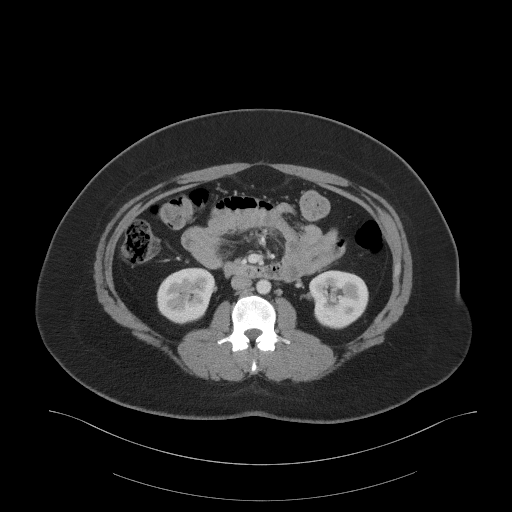
[im 61/96  bone]
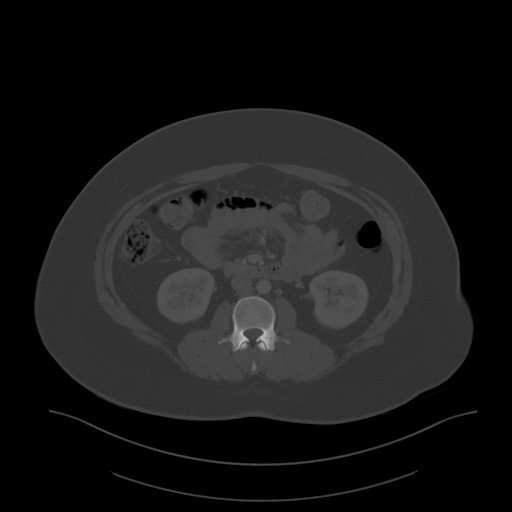
[im 71/96  soft-tissue]
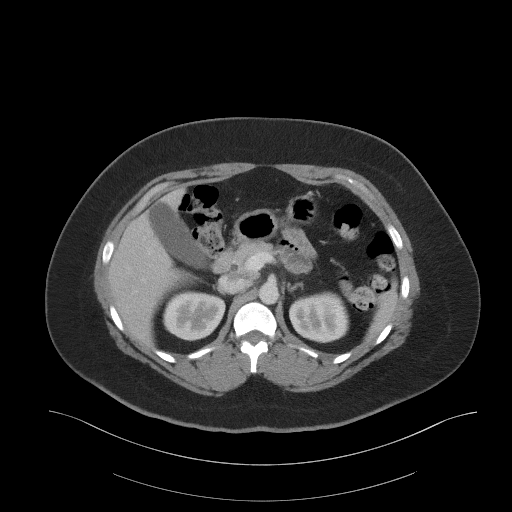
[im 76/96  soft-tissue]
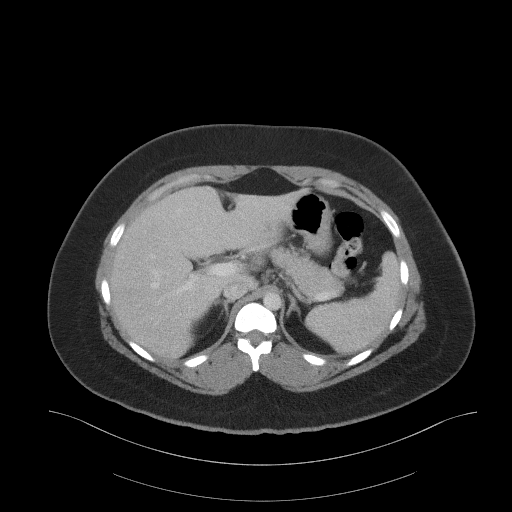
[im 81/96  soft-tissue]
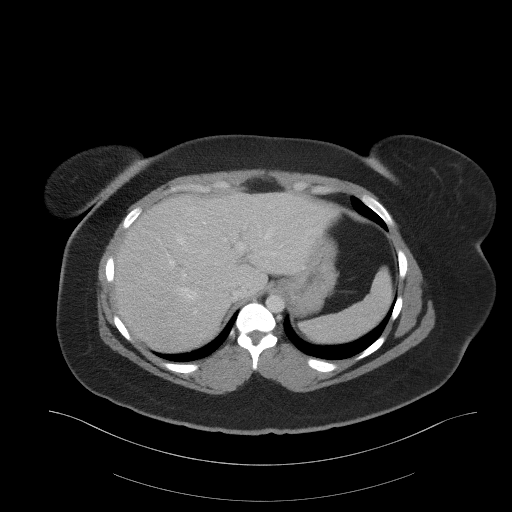
[im 91/96  soft-tissue]
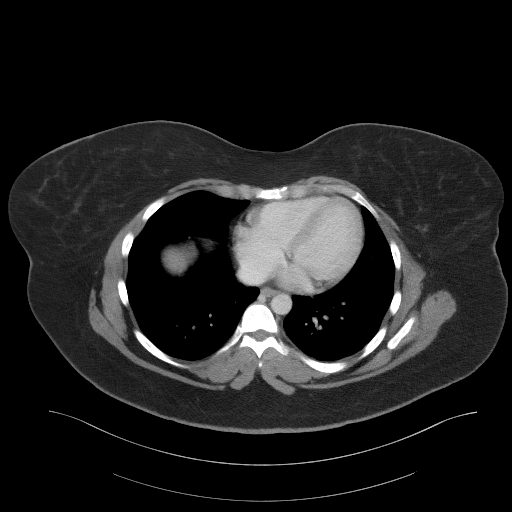

[Series 5: coronal st · coronal · 0.89mm/px · 3 of 113 slices shown]
[im 38/113  soft-tissue]
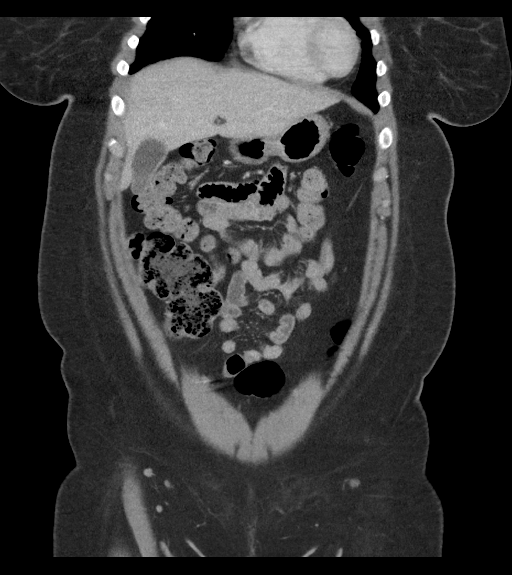
[im 50/113  soft-tissue]
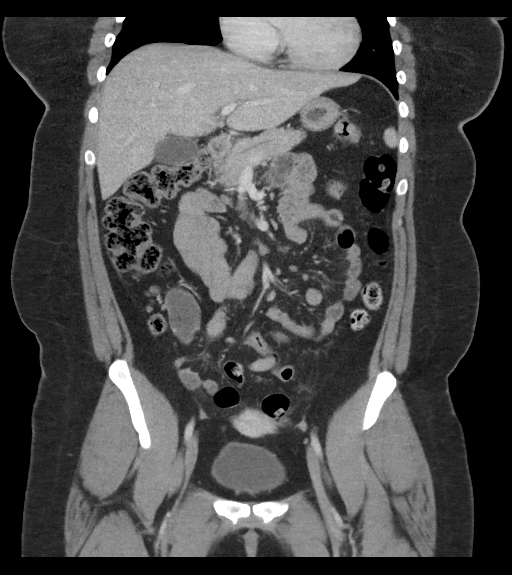
[im 63/113  soft-tissue]
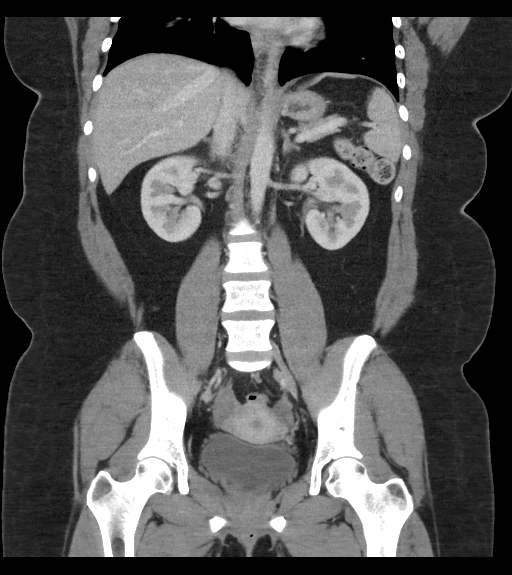

[16 of 46 positions shown; findings below may reference images not displayed]

FINDINGS: Lower chest: No acute abnormality.

Hepatobiliary: Geographic hypoattenuation in the left hemi-liver
adjacent to the fissure for the falciform ligament is nonspecific
but most suggestive of benign focal fatty infiltration. Normal
hepatic contour and morphology. No discrete hepatic lesions. Normal
appearance of the gallbladder. No intra or extrahepatic biliary
ductal dilatation.

Pancreas: Unremarkable. No pancreatic ductal dilatation or
surrounding inflammatory changes.

Spleen: Normal in size without focal abnormality.

Adrenals/Urinary Tract: Adrenal glands are unremarkable. Kidneys are
normal, without renal calculi, focal lesion, or hydronephrosis.
Bladder is unremarkable.

Stomach/Bowel: Stomach is within normal limits. Appendix appears
normal. No evidence of bowel wall thickening, distention, or
inflammatory changes.

Vascular/Lymphatic: No significant vascular findings are present. No
enlarged abdominal or pelvic lymph nodes.

Reproductive: Uterus and bilateral adnexa are unremarkable.

Other: No abdominal wall hernia or abnormality. No abdominopelvic
ascites.

Musculoskeletal: No acute fracture or aggressive appearing lytic or
blastic osseous lesion.
IMPRESSION: No acute abnormality within the abdomen or pelvis.

## 2023-07-12 ENCOUNTER — Ambulatory Visit: Payer: Medicaid Other | Admitting: Internal Medicine

## 2023-07-12 NOTE — Progress Notes (Unsigned)
    Name: Evelyn Landry  MRN/ DOB: [id number], [birth year]    Age/ Sex: 27 y.o., female    PCP: Pcp, No   Reason for Endocrinology Evaluation: MNG     Date of Initial Endocrinology Evaluation: 07/12/2023     HPI: Evelyn Landry is a 27 y.o. female with a past medical history of generalized anxiety disorder. The patient presented for initial endocrinology clinic visit on 07/12/2023 for consultative assistance with her MNG.   Patient has been diagnosed with multinodular goiter  HISTORY:  Past Medical History:  Past Medical History:  Diagnosis Date   Anxiety    Depression    Past Surgical History:  Past Surgical History:  Procedure Laterality Date   TONSILLECTOMY      Social History:  reports that she has never smoked. She has never used smokeless tobacco. She reports that she does not currently use alcohol. She reports that she does not use drugs. Family History: family history includes Diabetes in her maternal grandmother.   HOME MEDICATIONS: Allergies as of 07/12/2023   No Known Allergies      Medication List        Accurate as of July 12, 2023  7:05 AM. If you have any questions, ask your nurse or doctor.          hydrOXYzine 25 MG tablet Commonly known as: ATARAX Take 25 mg by mouth as needed.   ibuprofen 600 MG tablet Commonly known as: ADVIL Take 1 tablet (600 mg total) by mouth every 6 (six) hours. What changed:  when to take this reasons to take this          REVIEW OF SYSTEMS: A comprehensive ROS was conducted with the patient and is negative except as per HPI and below:  ROS     OBJECTIVE:  VS: There were no vitals taken for this visit.   Wt Readings from Last 3 Encounters:  10/11/21 187 lb 6.4 oz (85 kg)  09/23/21 180 lb (81.6 kg)  08/08/21 203 lb 11.2 oz (92.4 kg)     EXAM: General: Pt appears well and is in NAD  Eyes: External eye exam normal without stare, lid lag or exophthalmos.  EOM intact.  PERRL.  Neck:  General: Supple without adenopathy. Thyroid: Thyroid size normal.  No goiter or nodules appreciated. No thyroid bruit.  Lungs: Clear with good BS bilat   Heart: Auscultation: RRR.  Abdomen: Soft, nontender  Extremities:  BL LE: No pretibial edema   Mental Status: Judgment, insight: Intact Orientation: Oriented to time, place, and person Mood and affect: No depression, anxiety, or agitation     DATA REVIEWED:   Thyroid ultrasound 06/22/2023@[facility]  Everywhere   Nodule #1 Size: 0.6 x 0.5 x 0.7 cm Location: right lower Composition: solid or almost completely solid Echogenicity: hypoechoic Shape: not taller than wide Margins: smooth Echogenic foci :none No additional follow-up is recommended  ASSESSMENT/PLAN/RECOMMENDATIONS:   Multinodular goiter    Medications :  Signed electronically by: Lyndle Herrlich, MD  [facility]  [facility] [address] [place], [place] [address] Phone: [phone] FAX: [phone]   CC: Pcp, No No address on file Phone: None Fax: None   Return to Endocrinology clinic as below: Future Appointments  Date Time Provider Department Center  07/12/2023  9:30 AM Danine Hor, Konrad Dolores, MD [facility] None

## 2023-07-16 ENCOUNTER — Inpatient Hospital Stay (HOSPITAL_COMMUNITY)
Admission: AD | Admit: 2023-07-16 | Discharge: 2023-07-16 | Disposition: A | Discharge status: Home / Self Care | Payer: Managed Care, Other (non HMO) | Attending: Student | Admitting: Student

## 2023-07-16 ENCOUNTER — Other Ambulatory Visit: Payer: Self-pay

## 2023-07-16 ENCOUNTER — Inpatient Hospital Stay (HOSPITAL_COMMUNITY): Payer: Managed Care, Other (non HMO)

## 2023-07-16 ENCOUNTER — Encounter (HOSPITAL_COMMUNITY): Payer: Self-pay | Admitting: Student

## 2023-07-16 DIAGNOSIS — Z3A01 Less than 8 weeks gestation of pregnancy: Secondary | ICD-10-CM

## 2023-07-16 DIAGNOSIS — O039 Complete or unspecified spontaneous abortion without complication: Secondary | ICD-10-CM | POA: Insufficient documentation

## 2023-07-16 DIAGNOSIS — O209 Hemorrhage in early pregnancy, unspecified: Secondary | ICD-10-CM | POA: Diagnosis present

## 2023-07-16 DIAGNOSIS — N939 Abnormal uterine and vaginal bleeding, unspecified: Secondary | ICD-10-CM

## 2023-07-16 LAB — URINALYSIS, ROUTINE W REFLEX MICROSCOPIC

## 2023-07-16 LAB — CBC
HCT: 38.8 % (ref 36.0–46.0)
Hemoglobin: 13.3 g/dL (ref 12.0–15.0)
MCH: 32 pg (ref 26.0–34.0)
MCHC: 34.3 g/dL (ref 30.0–36.0)
MCV: 93.3 fL (ref 80.0–100.0)
Platelets: 244 10*3/uL (ref 150–400)
RBC: 4.16 MIL/uL (ref 3.87–5.11)
RDW: 12.2 % (ref 11.5–15.5)
WBC: 7.2 10*3/uL (ref 4.0–10.5)
nRBC: 0 % (ref 0.0–0.2)

## 2023-07-16 LAB — URINALYSIS, MICROSCOPIC (REFLEX): RBC / HPF: >50 RBC/hpf (ref 0–5)

## 2023-07-16 LAB — HCG, QUANTITATIVE, PREGNANCY: hCG, Beta Chain, Quant, S: 1364 m[IU]/mL — ABNORMAL HIGH (ref <5)

## 2023-07-16 LAB — POCT PREGNANCY, URINE: Preg Test, Ur: POSITIVE — AB

## 2023-07-16 NOTE — MAU Provider Note (Signed)
History     CSN: [id number]  Arrival date and time: 07/16/23 1651   Event Date/Time   First Provider Initiated Contact with Patient 07/16/23 1809      No chief complaint on file.  Evelyn Landry , a  27 y.o. [id number] at [redacted]w[redacted]d presents to MAU with complaints of increased vaginal bleeding. She started having spotting and cramping on Friday but was light. On Sunday patient began having increased cramping but spotting remained light. She was seen In the [facility] office for Ectopic r/o and noted to have a gestational sac w/o a YS or fetal pole. Today her vaginal bleeding became bright red, heavy and passing clots. She reports saturating 3 pads throughout the day and passing several golf ball size clots. She has no other complaints.          OB History     Gravida  5   Para  2   Term  2   Preterm      AB  2   Living  2      SAB  1   IAB  1   Ectopic      Multiple  0   Live Births  2           Past Medical History:  Diagnosis Date   Anxiety    Depression     Past Surgical History:  Procedure Laterality Date   TONSILLECTOMY      Family History  Problem Relation Age of Onset   Healthy Mother    Healthy Father    Diabetes Maternal Grandmother     Social History   Tobacco Use   Smoking status: Never   Smokeless tobacco: Never  Vaping Use   Vaping status: Never Used  Substance Use Topics   Alcohol use: Not Currently   Drug use: Never    Allergies: No Known Allergies  Medications Prior to Admission  Medication Sig Dispense Refill Last Dose   hydrOXYzine (ATARAX) 25 MG tablet Take 25 mg by mouth as needed.      ibuprofen (ADVIL) 600 MG tablet Take 1 tablet (600 mg total) by mouth every 6 (six) hours. (Patient taking differently: Take 600 mg by mouth as needed.) 30 tablet 0     Review of Systems  Constitutional:  Negative for chills, fatigue and fever.  Eyes:  Negative for pain and visual disturbance.  Respiratory:  Negative for apnea,  shortness of breath and wheezing.   Cardiovascular:  Negative for chest pain and palpitations.  Gastrointestinal:  Positive for abdominal pain. Negative for constipation, diarrhea, nausea and vomiting.  Genitourinary:  Positive for pelvic pain and vaginal bleeding. Negative for difficulty urinating, dysuria, vaginal discharge and vaginal pain.  Musculoskeletal:  Negative for back pain.  Neurological:  Negative for seizures, weakness and headaches.  Psychiatric/Behavioral:  Negative for suicidal ideas.    Physical Exam   Blood pressure 131/85, pulse 98, temperature 98.3 F (36.8 C), temperature source Oral, resp. rate 18, height 5\' 2"  (1.575 m), weight 100.2 kg, last menstrual period 05/25/2023, SpO2 100%, currently breastfeeding.  Physical Exam Vitals and nursing note reviewed. Exam conducted with a chaperone present.  Constitutional:      General: She is not in acute distress.    Appearance: Normal appearance.  HENT:     Head: Normocephalic.  Pulmonary:     Effort: Pulmonary effort is normal.  Genitourinary:    Comments: Bright red vaginal bleeding noted on pad.  Musculoskeletal:  Cervical back: Normal range of motion.  Skin:    General: Skin is warm and dry.  Neurological:     Mental Status: She is alert and oriented to person, place, and time.  Psychiatric:        Mood and Affect: Mood normal.     MAU Course  Procedures Orders Placed This Encounter  Procedures   US OB LESS THAN 14 WEEKS WITH OB TRANSVAGINAL   Urinalysis, Routine w reflex microscopic -Urine, Clean Catch   Urinalysis, Microscopic (reflex)   CBC   hCG, quantitative, pregnancy   Pregnancy, urine POC   Results for orders placed or performed during the hospital encounter of 07/16/23 (from the past 24 hour(s))  Pregnancy, urine POC     Status: Abnormal   Collection Time: 07/16/23  5:33 PM  Result Value Ref Range   Preg Test, Ur POSITIVE (A) NEGATIVE  Urinalysis, Routine w reflex microscopic -Urine,  Clean Catch     Status: Abnormal   Collection Time: 07/16/23  5:36 PM  Result Value Ref Range   Color, Urine RED (A) YELLOW   APPearance TURBID (A) CLEAR   Specific Gravity, Urine  1.005 - 1.030    TEST NOT REPORTED DUE TO COLOR INTERFERENCE OF URINE PIGMENT   pH  5.0 - 8.0    TEST NOT REPORTED DUE TO COLOR INTERFERENCE OF URINE PIGMENT   Glucose, UA (A) NEGATIVE mg/dL    TEST NOT REPORTED DUE TO COLOR INTERFERENCE OF URINE PIGMENT   Hgb urine dipstick (A) NEGATIVE    TEST NOT REPORTED DUE TO COLOR INTERFERENCE OF URINE PIGMENT   Bilirubin Urine (A) NEGATIVE    TEST NOT REPORTED DUE TO COLOR INTERFERENCE OF URINE PIGMENT   Ketones, ur (A) NEGATIVE mg/dL    TEST NOT REPORTED DUE TO COLOR INTERFERENCE OF URINE PIGMENT   Protein, ur (A) NEGATIVE mg/dL    TEST NOT REPORTED DUE TO COLOR INTERFERENCE OF URINE PIGMENT   Nitrite (A) NEGATIVE    TEST NOT REPORTED DUE TO COLOR INTERFERENCE OF URINE PIGMENT   Leukocytes,Ua (A) NEGATIVE    TEST NOT REPORTED DUE TO COLOR INTERFERENCE OF URINE PIGMENT  Urinalysis, Microscopic (reflex)     Status: Abnormal   Collection Time: 07/16/23  5:36 PM  Result Value Ref Range   RBC / HPF >50 0 - 5 RBC/hpf   WBC, UA 6-10 0 - 5 WBC/hpf   Bacteria, UA FEW (A) NONE SEEN   Squamous Epithelial / HPF 0-5 0 - 5 /HPF   Mucus PRESENT    Ca Oxalate Crys, UA PRESENT   CBC     Status: None   Collection Time: 07/16/23  6:41 PM  Result Value Ref Range   WBC 7.2 4.0 - 10.5 K/uL   RBC 4.16 3.87 - 5.11 MIL/uL   Hemoglobin 13.3 12.0 - 15.0 g/dL   HCT 46.9 62.9 - 52.8 %   MCV 93.3 80.0 - 100.0 fL   MCH 32.0 26.0 - 34.0 pg   MCHC 34.3 30.0 - 36.0 g/dL   RDW 41.3 24.4 - 01.0 %   Platelets 244 150 - 400 K/uL   nRBC 0.0 0.0 - 0.2 %   US OB LESS THAN 14 WEEKS WITH OB TRANSVAGINAL  Result Date: 07/16/2023 CLINICAL DATA:  Vaginal bleeding and cramping in 1st trimester pregnancy. EXAM: OBSTETRIC <14 WK [place] AND TRANSVAGINAL OB US TECHNIQUE: Both transabdominal and  transvaginal ultrasound examinations were performed for complete evaluation of the gestation as well as  the maternal uterus, adnexal regions, and pelvic cul-de-sac. Transvaginal technique was performed to assess early pregnancy. COMPARISON:  None Available. FINDINGS: Intrauterine gestational sac: None Maternal uterus/adnexae: Endometrial thickness measures 15 mm. No fibroids identified. Both ovaries are normal in appearance. No adnexal mass or abnormal free fluid identified. IMPRESSION: Pregnancy of unknown anatomic location (no intrauterine gestational sac or adnexal mass identified). Differential diagnosis includes recent spontaneous abortion, IUP too early to visualize, and non-visualized ectopic pregnancy. Recommend followup of beta-hCG levels, and follow up US as warranted clinically. Electronically Signed   By: Danae Orleans M.D.   On: 07/16/2023 19:39    MDM - Patient declined Spec Exam.  - CNM reviewed [place] from [facility] that confirmed a IUP with a GS on 07/15/23.  - [place] results today reveal PUL, consistent with a miscarriage.  - Call placed to Dr. Katrinka Blazing at [facility], reviewed patient presentation and current clinical picture. Per MD she will arrange to follow Quant back down to zero.  - plan for discharge.   Assessment and Plan   1. Miscarriage   2. Vaginal bleeding   3. [redacted] weeks gestation of pregnancy    - Reviewed that this is a complete miscarriage based on [place] results from yesterday to today.  - Reviewed worsening signs and return precautions.   - Discussed that office will follow up for routine testing. Patient verbalized understanding.  - Patient discharged home in stable condition and may return to MAU as needed.  - Condolences given.   Claudette Head, MSN CNM  07/16/2023, 6:09 PM

## 2023-07-16 NOTE — MAU Note (Signed)
Evelyn Landry is a 27 y.o. at Unknown here in MAU reporting: she has heavy VB with cramping that began today.  States was spotting yesterday but VB has increased.  Reports was seen yesterday at [facility] office and told she had an empty sac, not sure if ectopic pregnancy, early pregnancy or miscarriage. LMP: 05/25/2023 Onset of complaint: yesterday Pain score: 6 Vitals:   07/16/23 1720  BP: 129/80  Pulse: 83  Resp: 18  Temp: 98.3 F (36.8 C)  SpO2: 99%     FHT:NA Lab orders placed from triage:   UPT

## 2023-07-16 NOTE — Progress Notes (Signed)
Dorathy Daft CNM in earlier to discuss test results and d/c plan. Written and verbal d/c instructions given and understanding voiced. PT teary and emotional support given

## 2023-10-15 ENCOUNTER — Ambulatory Visit: Payer: Medicaid Other | Admitting: Internal Medicine

## 2023-10-15 ENCOUNTER — Encounter: Payer: Self-pay | Admitting: Internal Medicine

## 2023-10-15 VITALS — BP 112/74 | HR 112 | Ht 62.0 in | Wt 219.0 lb

## 2023-10-15 DIAGNOSIS — R635 Abnormal weight gain: Secondary | ICD-10-CM | POA: Insufficient documentation

## 2023-10-15 DIAGNOSIS — E049 Nontoxic goiter, unspecified: Secondary | ICD-10-CM | POA: Diagnosis not present

## 2023-10-15 DIAGNOSIS — E041 Nontoxic single thyroid nodule: Secondary | ICD-10-CM

## 2023-10-15 NOTE — Patient Instructions (Signed)
 24-Hour Urine Collection   You will be collecting your urine for a 24-hour period of time.  Your timer starts with your first urine of the morning (For example - If you first pee at 9AM, your timer will start at 9AM)  Throw away your first urine of the morning  Collect your urine every time you pee for the next 24 hours STOP your urine collection 24 hours after you started the collection (For example - You would stop at 9AM the day after you started)

## 2023-10-15 NOTE — Progress Notes (Unsigned)
 Name: Evelyn Landry  MRN/ DOB: [id number], 1995-09-27    Age/ Sex: 28 y.o., female    PCP: Pcp, No   Reason for Endocrinology Evaluation: MNG     Date of Initial Endocrinology Evaluation: 10/15/2023     HPI: Ms. Evelyn Landry is a 28 y.o. female with a past medical history of Obesity . The patient presented for initial endocrinology clinic visit on 10/15/2023 for consultative assistance with her MNG.     She was diagnosed with a subcentimeter thyroid nodule on ultrasound 06/2023 Patient was recently treated conservatively for appendicitis  Has stable and chronic goiter  Has occasional dysphagia  Has occasional palpitations , pt on phentermine  Has chronic changes in bowel movements , colonoscopy was unrevealing  Has occasional tremors in the morning  No new hirsutism  LMP last month- regular  She has history of infertility , but has 2 kids a boy and a girl without assistance Has FH of thyroid disease    Menarche  9 Has history of irregular menstruations  COC's didn't help, progesterone IUD caused  mood changes Copper IUD caused excessive menstruations     HISTORY:  Past Medical History:  Past Medical History:  Diagnosis Date   Anxiety    Depression    Past Surgical History:  Past Surgical History:  Procedure Laterality Date   TONSILLECTOMY      Social History:  reports that she has never smoked. She has never used smokeless tobacco. She reports that she does not currently use alcohol. She reports that she does not use drugs. Family History: family history includes Diabetes in her maternal grandmother; Healthy in her father and mother.   HOME MEDICATIONS: Allergies as of 10/15/2023   No Known Allergies      Medication List        Accurate as of October 15, 2023  8:14 AM. If you have any questions, ask your nurse or doctor.          hydrOXYzine 25 MG tablet Commonly known as: ATARAX Take 25 mg by mouth as needed.   ibuprofen 600 MG  tablet Commonly known as: ADVIL Take 1 tablet (600 mg total) by mouth every 6 (six) hours. What changed:  when to take this reasons to take this          REVIEW OF SYSTEMS: A comprehensive ROS was conducted with the patient and is negative except as per HPI and below:  ROS     OBJECTIVE:  VS: BP 112/74 (BP Location: Left Arm, Patient Position: Sitting, Cuff Size: Normal)   Pulse (!) 112   Ht 5\' 2"  (1.575 m)   Wt 219 lb (99.3 kg)   LMP 05/25/2023   SpO2 97%   Breastfeeding Unknown   BMI 40.06 kg/m    Wt Readings from Last 3 Encounters:  07/16/23 220 lb 12.8 oz (100.2 kg)  10/11/21 187 lb 6.4 oz (85 kg)  09/23/21 180 lb (81.6 kg)     EXAM: General: Pt appears well and is in NAD  Neck: General: Supple without adenopathy. Thyroid: Thyroid is prominent  Lungs: Clear with good BS bilat   Heart: Auscultation: RRR.  Abdomen: Soft, nontender  Extremities:  BL LE: No pretibial edema   Mental Status: Judgment, insight: Intact Orientation: Oriented to time, place, and person Mood and affect: No depression, anxiety, or agitation     DATA REVIEWED:   Latest Reference Range & Units 10/15/23 14:05  TSH mIU/L 1.37  T4,Free(Direct) 0.8 -  1.8 ng/dL 1.3        Thyroid Ultrasound 06/22/2023 FINDINGS: Thyroid size: Right thyroid: 5.2 x 1.3 x 2.2 cm Left thyroid: 4.7 x 1.3 x 1.7 cm Isthmus: .3 cm  Thyroid echotexture: Homogeneous  Nodule: 1 Size: 0.6 x 0.5 x 0.7 cm Location: Right Lower Composition: solid or almost completely solid (2) Echogenicity: Hypoechoic (2) Shape: Not taller than wide (0) Margin: Smooth (0) Echogenic foci: None (0) Additional echogenic foci 1:  ACR TI-RADS total points: 4 ACR TI-RADS risk category: TI-RADS 4 ACR TI-RADS recommendation: No further follow up needed  IMPRESSION: Subcentimeter solid TI-RADS 4 nodule in the lower right thyroid lobe. No additional follow-up recommended.   ASSESSMENT/PLAN/RECOMMENDATIONS:    MNG:  -No local neck symptoms -TFTs are normal -She does have a subcentimeter right thyroid nodule that did not require follow-up, reassurance provided -Will have low threshold for repeating ultrasound -Anti-TPO antibodies pending   2.  Weight gain  -She has tried multiple diets including Noom for a year and a half with very minimal weight loss -I will proceed with 24-hour urinary cortisol to rule out Cushing syndrome   Follow-up in 6 months    Signed electronically by: Lyndle Herrlich, MD  [facility] Endocrinology  [facility] [address] [place] [place], [place] [address] Phone: [phone] FAX: [phone]   CC: Pcp, No No address on file Phone: None Fax: None   Return to Endocrinology clinic as below: Future Appointments  Date Time Provider Department Center  10/15/2023  1:20 PM Taffy Delconte, Konrad Dolores, MD [id number] None

## 2023-10-16 ENCOUNTER — Encounter: Payer: Self-pay | Admitting: Internal Medicine

## 2023-10-19 LAB — T4, FREE: Free T4: 1.3 ng/dL (ref 0.8–1.8)

## 2023-10-19 LAB — TSH: TSH: 1.37 m[IU]/L

## 2023-10-19 LAB — ACTH: C206 ACTH: 17 pg/mL (ref 6–50)

## 2023-10-19 LAB — CORTISOL: Cortisol, Plasma: 7.3 ug/dL

## 2023-10-19 LAB — THYROID PEROXIDASE ANTIBODY: Thyroperoxidase Ab SerPl-aCnc: <1 [IU]/mL (ref <9)

## 2024-04-16 ENCOUNTER — Ambulatory Visit: Admitting: Internal Medicine
